# Patient Record
Sex: Female | Born: 1945 | Race: White | Hispanic: No | Smoking: Former smoker
Health system: Southern US, Community
[De-identification: ages and names within clinical notes are randomized; demographics above are authoritative.]

## PROBLEM LIST (undated history)

## (undated) DIAGNOSIS — Z923 Personal history of irradiation: Secondary | ICD-10-CM

## (undated) DIAGNOSIS — M1990 Unspecified osteoarthritis, unspecified site: Secondary | ICD-10-CM

## (undated) DIAGNOSIS — J302 Other seasonal allergic rhinitis: Secondary | ICD-10-CM

## (undated) DIAGNOSIS — Z972 Presence of dental prosthetic device (complete) (partial): Secondary | ICD-10-CM

## (undated) DIAGNOSIS — D563 Thalassemia minor: Secondary | ICD-10-CM

## (undated) DIAGNOSIS — C50919 Malignant neoplasm of unspecified site of unspecified female breast: Secondary | ICD-10-CM

## (undated) HISTORY — PX: TUBAL LIGATION: SHX77

## (undated) HISTORY — PX: BREAST LUMPECTOMY: SHX2

## (undated) HISTORY — PX: TONSILLECTOMY: SUR1361

---

## 1993-09-30 DIAGNOSIS — C50919 Malignant neoplasm of unspecified site of unspecified female breast: Secondary | ICD-10-CM

## 1993-09-30 HISTORY — PX: BREAST BIOPSY: SHX20

## 1993-09-30 HISTORY — DX: Malignant neoplasm of unspecified site of unspecified female breast: C50.919

## 2004-10-22 ENCOUNTER — Ambulatory Visit: Payer: Self-pay | Admitting: Family Medicine

## 2005-11-22 ENCOUNTER — Ambulatory Visit: Payer: Self-pay | Admitting: Family Medicine

## 2006-11-21 ENCOUNTER — Ambulatory Visit: Payer: Self-pay | Admitting: Family Medicine

## 2006-12-01 ENCOUNTER — Ambulatory Visit: Payer: Self-pay | Admitting: Family Medicine

## 2007-04-08 ENCOUNTER — Ambulatory Visit: Payer: Self-pay | Admitting: Podiatry

## 2007-04-08 HISTORY — PX: BUNIONECTOMY: SHX129

## 2007-12-04 ENCOUNTER — Ambulatory Visit: Payer: Self-pay | Admitting: Family Medicine

## 2008-11-28 ENCOUNTER — Ambulatory Visit: Payer: Self-pay | Admitting: Family Medicine

## 2008-12-12 ENCOUNTER — Ambulatory Visit: Payer: Self-pay | Admitting: Family Medicine

## 2010-03-19 ENCOUNTER — Ambulatory Visit: Payer: Self-pay | Admitting: Unknown Physician Specialty

## 2010-05-08 ENCOUNTER — Ambulatory Visit: Payer: Self-pay | Admitting: Unknown Physician Specialty

## 2010-05-21 ENCOUNTER — Ambulatory Visit: Payer: Self-pay | Admitting: Unknown Physician Specialty

## 2011-05-28 ENCOUNTER — Ambulatory Visit: Payer: Self-pay | Admitting: Family Medicine

## 2012-09-04 ENCOUNTER — Ambulatory Visit: Payer: Self-pay | Admitting: Family Medicine

## 2012-09-14 ENCOUNTER — Ambulatory Visit: Payer: Self-pay | Admitting: Family Medicine

## 2013-05-13 ENCOUNTER — Ambulatory Visit: Payer: Self-pay | Admitting: Family Medicine

## 2013-08-20 ENCOUNTER — Ambulatory Visit: Payer: Self-pay | Admitting: Gastroenterology

## 2013-08-23 LAB — PATHOLOGY REPORT

## 2013-10-28 ENCOUNTER — Ambulatory Visit: Payer: Self-pay | Admitting: Family Medicine

## 2014-09-14 ENCOUNTER — Ambulatory Visit: Payer: Self-pay | Admitting: Family Medicine

## 2014-11-10 ENCOUNTER — Ambulatory Visit: Payer: Self-pay | Admitting: Family Medicine

## 2015-11-14 ENCOUNTER — Encounter: Payer: Self-pay | Admitting: *Deleted

## 2015-11-16 NOTE — Discharge Instructions (Signed)

## 2015-11-20 ENCOUNTER — Encounter: Payer: Self-pay | Admitting: *Deleted

## 2015-11-20 ENCOUNTER — Ambulatory Visit: Payer: Medicare HMO | Admitting: Anesthesiology

## 2015-11-20 ENCOUNTER — Ambulatory Visit
Admission: RE | Admit: 2015-11-20 | Discharge: 2015-11-20 | Disposition: A | Discharge status: Home / Self Care | Payer: Medicare HMO | Source: Ambulatory Visit | Attending: Ophthalmology | Admitting: Ophthalmology

## 2015-11-20 ENCOUNTER — Encounter
Admission: RE | Disposition: A | Discharge status: Home / Self Care | Payer: Self-pay | Source: Ambulatory Visit | Attending: Ophthalmology

## 2015-11-20 DIAGNOSIS — H2512 Age-related nuclear cataract, left eye: Secondary | ICD-10-CM | POA: Diagnosis present

## 2015-11-20 DIAGNOSIS — D649 Anemia, unspecified: Secondary | ICD-10-CM | POA: Diagnosis not present

## 2015-11-20 DIAGNOSIS — D563 Thalassemia minor: Secondary | ICD-10-CM | POA: Insufficient documentation

## 2015-11-20 DIAGNOSIS — Z87891 Personal history of nicotine dependence: Secondary | ICD-10-CM | POA: Insufficient documentation

## 2015-11-20 DIAGNOSIS — Z880 Allergy status to penicillin: Secondary | ICD-10-CM | POA: Diagnosis not present

## 2015-11-20 DIAGNOSIS — Z853 Personal history of malignant neoplasm of breast: Secondary | ICD-10-CM | POA: Diagnosis not present

## 2015-11-20 DIAGNOSIS — Z79899 Other long term (current) drug therapy: Secondary | ICD-10-CM | POA: Insufficient documentation

## 2015-11-20 HISTORY — DX: Unspecified osteoarthritis, unspecified site: M19.90

## 2015-11-20 HISTORY — DX: Malignant neoplasm of unspecified site of unspecified female breast: C50.919

## 2015-11-20 HISTORY — DX: Other seasonal allergic rhinitis: J30.2

## 2015-11-20 HISTORY — DX: Thalassemia minor: D56.3

## 2015-11-20 HISTORY — PX: CATARACT EXTRACTION W/PHACO: SHX586

## 2015-11-20 HISTORY — DX: Presence of dental prosthetic device (complete) (partial): Z97.2

## 2015-11-20 SURGERY — PHACOEMULSIFICATION, CATARACT, WITH IOL INSERTION
Anesthesia: Monitor Anesthesia Care | Site: Eye | Laterality: Left | Wound class: Clean

## 2015-11-20 MED ORDER — ARMC OPHTHALMIC DILATING GEL
1.0000 "application " | OPHTHALMIC | Status: DC | PRN
  Administered 2015-11-20 (×2): 1 via OPHTHALMIC

## 2015-11-20 MED ORDER — MIDAZOLAM HCL 2 MG/2ML IJ SOLN
INTRAMUSCULAR | Status: DC | PRN
  Administered 2015-11-20: 2 mg via INTRAVENOUS

## 2015-11-20 MED ORDER — POVIDONE-IODINE 5 % OP SOLN
1.0000 "application " | OPHTHALMIC | Status: DC | PRN
  Administered 2015-11-20: 1 via OPHTHALMIC

## 2015-11-20 MED ORDER — LACTATED RINGERS IV SOLN: INTRAVENOUS | Status: DC

## 2015-11-20 MED ORDER — BSS IO SOLN
INTRAOCULAR | Status: DC | PRN
  Administered 2015-11-20: 85 mL via OPHTHALMIC

## 2015-11-20 MED ORDER — MOXIFLOXACIN HCL 0.5 % OP SOLN
OPHTHALMIC | Status: DC | PRN
  Administered 2015-11-20: .1 mL via OPHTHALMIC

## 2015-11-20 MED ORDER — TIMOLOL MALEATE 0.5 % OP SOLN
OPHTHALMIC | Status: DC | PRN
  Administered 2015-11-20: 1 [drp] via OPHTHALMIC

## 2015-11-20 MED ORDER — TETRACAINE HCL 0.5 % OP SOLN
1.0000 [drp] | OPHTHALMIC | Status: DC | PRN
  Administered 2015-11-20: 1 [drp] via OPHTHALMIC

## 2015-11-20 MED ORDER — NA HYALUR & NA CHOND-NA HYALUR 0.4-0.35 ML IO KIT
PACK | INTRAOCULAR | Status: DC | PRN
  Administered 2015-11-20: 1 mL via INTRAOCULAR

## 2015-11-20 MED ORDER — LIDOCAINE HCL (PF) 4 % IJ SOLN
INTRAOCULAR | Status: DC | PRN
  Administered 2015-11-20: 1 mL via OPHTHALMIC

## 2015-11-20 MED ORDER — BRIMONIDINE TARTRATE 0.2 % OP SOLN
OPHTHALMIC | Status: DC | PRN
  Administered 2015-11-20: 1 [drp] via OPHTHALMIC

## 2015-11-20 MED ORDER — FENTANYL CITRATE (PF) 100 MCG/2ML IJ SOLN
INTRAMUSCULAR | Status: DC | PRN
  Administered 2015-11-20: 50 ug via INTRAVENOUS

## 2015-11-20 SURGICAL SUPPLY — 29 items
APPLICATOR COTTON TIP 3IN (MISCELLANEOUS) ×3 IMPLANT
CANNULA ANT/CHMB 27GA (MISCELLANEOUS) ×3 IMPLANT
DISSECTOR HYDRO NUCLEUS 50X22 (MISCELLANEOUS) ×3 IMPLANT
GLOVE BIO SURGEON STRL SZ7 (GLOVE) ×3 IMPLANT
GLOVE SURG LX 6.5 MICRO (GLOVE) ×4
GLOVE SURG LX STRL 6.5 MICRO (GLOVE) ×2 IMPLANT
GOWN STRL REUS W/ TWL LRG LVL3 (GOWN DISPOSABLE) ×2 IMPLANT
GOWN STRL REUS W/TWL LRG LVL3 (GOWN DISPOSABLE) ×4
LENS IOL TECNIS 34.0 (Intraocular Lens) ×3 IMPLANT
LENS IOL TECNIS MONO 1P 34.0 (Intraocular Lens) ×1 IMPLANT
MARKER SKIN DUAL TIP RULER LAB (MISCELLANEOUS) ×3 IMPLANT
NEEDLE FILTER BLUNT 18X 1/2SAF (NEEDLE) ×4
NEEDLE FILTER BLUNT 18X1 1/2 (NEEDLE) ×2 IMPLANT
PACK CATARACT BRASINGTON (MISCELLANEOUS) ×3 IMPLANT
PACK EYE AFTER SURG (MISCELLANEOUS) ×3 IMPLANT
PACK OPTHALMIC (MISCELLANEOUS) ×3 IMPLANT
RING MALYGIN 7.0 (MISCELLANEOUS) IMPLANT
SOL BAL SALT 15ML (MISCELLANEOUS)
SOLUTION BAL SALT 15ML (MISCELLANEOUS) IMPLANT
SUT ETHILON 10-0 CS-B-6CS-B-6 (SUTURE)
SUT VICRYL  9 0 (SUTURE)
SUT VICRYL 9 0 (SUTURE) IMPLANT
SUTURE EHLN 10-0 CS-B-6CS-B-6 (SUTURE) IMPLANT
SYR 3ML LL SCALE MARK (SYRINGE) ×6 IMPLANT
SYR TB 1ML LUER SLIP (SYRINGE) ×3 IMPLANT
WATER STERILE IRR 250ML POUR (IV SOLUTION) ×3 IMPLANT
WATER STERILE IRR 500ML POUR (IV SOLUTION) IMPLANT
WICK EYE OCUCEL (MISCELLANEOUS) IMPLANT
WIPE NON LINTING 3.25X3.25 (MISCELLANEOUS) ×3 IMPLANT

## 2015-11-20 NOTE — Transfer of Care (Signed)
Immediate Anesthesia Transfer of Care Note  Patient: Carolyn Coleman  Procedure(s) Performed: Procedure(s): CATARACT EXTRACTION PHACO AND INTRAOCULAR LENS PLACEMENT (IOC) (Left)  Patient Location: PACU  Anesthesia Type: MAC  Level of Consciousness: awake, alert  and patient cooperative  Airway and Oxygen Therapy: Patient Spontanous Breathing and Patient connected to supplemental oxygen  Post-op Assessment: Post-op Vital signs reviewed, Patient's Cardiovascular Status Stable, Respiratory Function Stable, Patent Airway and No signs of Nausea or vomiting  Post-op Vital Signs: Reviewed and stable  Complications: No apparent anesthesia complications

## 2015-11-20 NOTE — Anesthesia Preprocedure Evaluation (Signed)
Anesthesia Evaluation  Patient identified by MRN, date of birth, ID band Patient awake    Reviewed: Allergy & Precautions, H&P , NPO status , Patient's Chart, lab work & pertinent test results, reviewed documented beta blocker date and time   Airway Mallampati: II  TM Distance: >3 FB Neck ROM: full    Dental no notable dental hx. (+) Upper Dentures, Lower Dentures   Pulmonary neg pulmonary ROS, former smoker,    Pulmonary exam normal breath sounds clear to auscultation       Cardiovascular Exercise Tolerance: Good negative cardio ROS   Rhythm:regular Rate:Normal     Neuro/Psych negative neurological ROS  negative psych ROS   GI/Hepatic negative GI ROS, Neg liver ROS,   Endo/Other  negative endocrine ROS  Renal/GU negative Renal ROS  negative genitourinary   Musculoskeletal   Abdominal   Peds  Hematology negative hematology ROS (+) anemia ,   Anesthesia Other Findings   Reproductive/Obstetrics negative OB ROS                             Anesthesia Physical Anesthesia Plan  ASA: II  Anesthesia Plan: MAC   Post-op Pain Management:    Induction:   Airway Management Planned:   Additional Equipment:   Intra-op Plan:   Post-operative Plan:   Informed Consent: I have reviewed the patients History and Physical, chart, labs and discussed the procedure including the risks, benefits and alternatives for the proposed anesthesia with the patient or authorized representative who has indicated his/her understanding and acceptance.   Dental Advisory Given  Plan Discussed with: CRNA  Anesthesia Plan Comments:         Anesthesia Quick Evaluation

## 2015-11-20 NOTE — Op Note (Signed)
Date of Surgery: 11/20/2015  PREOPERATIVE DIAGNOSES: Visually significant nuclear sclerotic cataract, left eye.  POSTOPERATIVE DIAGNOSES: Same  PROCEDURES PERFORMED: Cataract extraction with intraocular lens implant, left eye.  SURGEON: Almon Hercules, M.D.  ANESTHESIA: MAC and topical  IMPLANTS: AMO Tecnis [id number] +34.0  Implant Name Type Inv. Item Serial No. Manufacturer Lot No. LRB No. Used  LENS IOL TECNIS 34.0 - [id number] Intraocular Lens LENS IOL TECNIS 34.0 [id number] AMO   Left 1    COMPLICATIONS: None.  DESCRIPTION OF PROCEDURE: Therapeutic options were discussed with the patient preoperatively, including a discussion of risks and benefits of surgery. Informed consent was obtained. An IOL-Master and immersion biometry were used to take the lens measurements, and a dilated fundus exam was performed within 6 months of the surgical date.  The patient was premedicated and brought to the operating room and placed on the operating table in the supine position. After adequate anesthesia, the patient was prepped and draped in the usual sterile ophthalmic fashion. A wire lid speculum was inserted and the microscope was positioned. A Superblade was used to create a paracentesis site at the limbus and a small amount of dilute preservative free lidocaine was instilled into the anterior chamber, followed by dispersive viscoelastic. A clear corneal incision was created temporally using a 2.4 mm keratome blade. Capsulorrhexis was then performed. In situ phacoemulsification was performed.  Cortical material was removed with the irrigation-aspiration unit. Dispersive viscoelastic was instilled to open the capsular bag. A posterior chamber intraocular lens with the specifications above was inserted and positioned. Irrigation-aspiration was used to remove all viscoelastic. Vigamox 1cc was instilled into the anterior chamber, and the corneal incision was checked and found to be water tight. The eyelid  speculum was removed.  The operative eye was covered with protective goggles after instilling 1 drop of timolol and brimonidine. The patient tolerated the procedure well. There were no complications.

## 2015-11-20 NOTE — Anesthesia Postprocedure Evaluation (Signed)
Anesthesia Post Note  Patient: Carolyn Coleman  Procedure(s) Performed: Procedure(s) (LRB): CATARACT EXTRACTION PHACO AND INTRAOCULAR LENS PLACEMENT (IOC) (Left)  Patient location during evaluation: PACU Anesthesia Type: MAC Level of consciousness: awake and alert Pain management: pain level controlled Vital Signs Assessment: post-procedure vital signs reviewed and stable Respiratory status: spontaneous breathing, nonlabored ventilation, respiratory function stable and patient connected to nasal cannula oxygen Cardiovascular status: blood pressure returned to baseline and stable Postop Assessment: no signs of nausea or vomiting Anesthetic complications: no    Alisa Graff

## 2015-11-20 NOTE — H&P (Signed)
H+P reviewed and is up to date, please see paper chart.  

## 2015-11-20 NOTE — Anesthesia Procedure Notes (Signed)
Procedure Name: MAC Performed by: Avari Gelles Pre-anesthesia Checklist: Patient identified, Emergency Drugs available, Suction available, Timeout performed and Patient being monitored Patient Re-evaluated:Patient Re-evaluated prior to inductionOxygen Delivery Method: Nasal cannula Placement Confirmation: positive ETCO2     

## 2015-11-21 ENCOUNTER — Encounter: Payer: Self-pay | Admitting: Ophthalmology

## 2015-12-06 ENCOUNTER — Other Ambulatory Visit: Payer: Self-pay | Admitting: Family Medicine

## 2015-12-06 DIAGNOSIS — Z1231 Encounter for screening mammogram for malignant neoplasm of breast: Secondary | ICD-10-CM

## 2015-12-08 ENCOUNTER — Encounter: Payer: Self-pay | Admitting: *Deleted

## 2015-12-08 NOTE — Discharge Instructions (Signed)

## 2015-12-11 ENCOUNTER — Ambulatory Visit: Payer: Medicare HMO | Admitting: Anesthesiology

## 2015-12-11 ENCOUNTER — Encounter
Admission: RE | Disposition: A | Discharge status: Home / Self Care | Payer: Self-pay | Source: Ambulatory Visit | Attending: Ophthalmology

## 2015-12-11 ENCOUNTER — Ambulatory Visit
Admission: RE | Admit: 2015-12-11 | Discharge: 2015-12-11 | Disposition: A | Discharge status: Home / Self Care | Payer: Medicare HMO | Source: Ambulatory Visit | Attending: Ophthalmology | Admitting: Ophthalmology

## 2015-12-11 DIAGNOSIS — Z880 Allergy status to penicillin: Secondary | ICD-10-CM | POA: Insufficient documentation

## 2015-12-11 DIAGNOSIS — Z79899 Other long term (current) drug therapy: Secondary | ICD-10-CM | POA: Insufficient documentation

## 2015-12-11 DIAGNOSIS — Z9851 Tubal ligation status: Secondary | ICD-10-CM | POA: Insufficient documentation

## 2015-12-11 DIAGNOSIS — Z87891 Personal history of nicotine dependence: Secondary | ICD-10-CM | POA: Insufficient documentation

## 2015-12-11 DIAGNOSIS — H2511 Age-related nuclear cataract, right eye: Secondary | ICD-10-CM | POA: Diagnosis present

## 2015-12-11 DIAGNOSIS — Z853 Personal history of malignant neoplasm of breast: Secondary | ICD-10-CM | POA: Diagnosis not present

## 2015-12-11 HISTORY — PX: CATARACT EXTRACTION W/PHACO: SHX586

## 2015-12-11 SURGERY — PHACOEMULSIFICATION, CATARACT, WITH IOL INSERTION
Anesthesia: Monitor Anesthesia Care | Laterality: Right | Wound class: Clean

## 2015-12-11 MED ORDER — BSS IO SOLN
INTRAOCULAR | Status: DC | PRN
  Administered 2015-12-11: 88 mL via OPHTHALMIC

## 2015-12-11 MED ORDER — NA HYALUR & NA CHOND-NA HYALUR 0.4-0.35 ML IO KIT
PACK | INTRAOCULAR | Status: DC | PRN
  Administered 2015-12-11: 1 mL via INTRAOCULAR

## 2015-12-11 MED ORDER — LIDOCAINE HCL (PF) 4 % IJ SOLN
INTRAOCULAR | Status: DC | PRN
  Administered 2015-12-11: 4 mL via OPHTHALMIC

## 2015-12-11 MED ORDER — MIDAZOLAM HCL 2 MG/2ML IJ SOLN
INTRAMUSCULAR | Status: DC | PRN
  Administered 2015-12-11: 2 mg via INTRAVENOUS

## 2015-12-11 MED ORDER — MOXIFLOXACIN HCL 0.5 % OP SOLN
OPHTHALMIC | Status: DC | PRN
  Administered 2015-12-11: .2 mL via OPHTHALMIC

## 2015-12-11 MED ORDER — ACETAMINOPHEN 160 MG/5ML PO SOLN: 325.0000 mg | ORAL | Status: DC | PRN

## 2015-12-11 MED ORDER — TETRACAINE HCL 0.5 % OP SOLN
1.0000 [drp] | OPHTHALMIC | Status: DC | PRN
  Administered 2015-12-11: 1 [drp] via OPHTHALMIC

## 2015-12-11 MED ORDER — ACETAMINOPHEN 325 MG PO TABS: 325.0000 mg | ORAL_TABLET | ORAL | Status: DC | PRN

## 2015-12-11 MED ORDER — ONDANSETRON HCL 4 MG/2ML IJ SOLN: 4.0000 mg | Freq: Once | INTRAMUSCULAR | Status: DC | PRN

## 2015-12-11 MED ORDER — POVIDONE-IODINE 5 % OP SOLN
1.0000 "application " | OPHTHALMIC | Status: DC | PRN
  Administered 2015-12-11: 1 via OPHTHALMIC

## 2015-12-11 MED ORDER — FENTANYL CITRATE (PF) 100 MCG/2ML IJ SOLN
INTRAMUSCULAR | Status: DC | PRN
  Administered 2015-12-11: 50 ug via INTRAVENOUS

## 2015-12-11 MED ORDER — TIMOLOL MALEATE 0.5 % OP SOLN
OPHTHALMIC | Status: DC | PRN
  Administered 2015-12-11: 1 [drp] via OPHTHALMIC

## 2015-12-11 MED ORDER — BRIMONIDINE TARTRATE 0.2 % OP SOLN
OPHTHALMIC | Status: DC | PRN
  Administered 2015-12-11: 1 [drp] via OPHTHALMIC

## 2015-12-11 MED ORDER — ARMC OPHTHALMIC DILATING GEL
1.0000 "application " | OPHTHALMIC | Status: DC | PRN
  Administered 2015-12-11 (×2): 1 via OPHTHALMIC

## 2015-12-11 SURGICAL SUPPLY — 29 items
APPLICATOR COTTON TIP 3IN (MISCELLANEOUS) ×3 IMPLANT
CANNULA ANT/CHMB 27GA (MISCELLANEOUS) ×3 IMPLANT
DISSECTOR HYDRO NUCLEUS 50X22 (MISCELLANEOUS) ×3 IMPLANT
GLOVE BIO SURGEON STRL SZ7 (GLOVE) ×3 IMPLANT
GLOVE SURG LX 6.5 MICRO (GLOVE) ×2
GLOVE SURG LX STRL 6.5 MICRO (GLOVE) ×1 IMPLANT
GOWN STRL REUS W/ TWL LRG LVL3 (GOWN DISPOSABLE) ×2 IMPLANT
GOWN STRL REUS W/TWL LRG LVL3 (GOWN DISPOSABLE) ×4
LENS IOL TECNIS 32.5 (Intraocular Lens) ×3 IMPLANT
LENS IOL TECNIS MONO 1P 32.5 (Intraocular Lens) ×1 IMPLANT
MARKER SKIN DUAL TIP RULER LAB (MISCELLANEOUS) ×3 IMPLANT
NEEDLE FILTER BLUNT 18X 1/2SAF (NEEDLE) ×2
NEEDLE FILTER BLUNT 18X1 1/2 (NEEDLE) ×1 IMPLANT
PACK CATARACT BRASINGTON (MISCELLANEOUS) ×3 IMPLANT
PACK EYE AFTER SURG (MISCELLANEOUS) ×3 IMPLANT
PACK OPTHALMIC (MISCELLANEOUS) ×3 IMPLANT
RING MALYGIN 7.0 (MISCELLANEOUS) IMPLANT
SOL BAL SALT 15ML (MISCELLANEOUS)
SOLUTION BAL SALT 15ML (MISCELLANEOUS) IMPLANT
SUT ETHILON 10-0 CS-B-6CS-B-6 (SUTURE)
SUT VICRYL  9 0 (SUTURE)
SUT VICRYL 9 0 (SUTURE) IMPLANT
SUTURE EHLN 10-0 CS-B-6CS-B-6 (SUTURE) IMPLANT
SYR 3ML LL SCALE MARK (SYRINGE) ×3 IMPLANT
SYR TB 1ML LUER SLIP (SYRINGE) ×3 IMPLANT
WATER STERILE IRR 250ML POUR (IV SOLUTION) ×3 IMPLANT
WATER STERILE IRR 500ML POUR (IV SOLUTION) IMPLANT
WICK EYE OCUCEL (MISCELLANEOUS) IMPLANT
WIPE NON LINTING 3.25X3.25 (MISCELLANEOUS) ×3 IMPLANT

## 2015-12-11 NOTE — Anesthesia Postprocedure Evaluation (Signed)
Anesthesia Post Note  Patient: Carolyn Coleman  Procedure(s) Performed: Procedure(s) (LRB): CATARACT EXTRACTION PHACO AND INTRAOCULAR LENS PLACEMENT (IOC) (Right)  Patient location during evaluation: PACU Anesthesia Type: MAC Level of consciousness: awake and alert Pain management: pain level controlled Vital Signs Assessment: post-procedure vital signs reviewed and stable Respiratory status: spontaneous breathing, nonlabored ventilation, respiratory function stable and patient connected to nasal cannula oxygen Cardiovascular status: stable and blood pressure returned to baseline Anesthetic complications: no    Amaryllis Dyke

## 2015-12-11 NOTE — H&P (Signed)
H+P reviewed and is up to date, please see paper chart.  

## 2015-12-11 NOTE — Transfer of Care (Signed)
Immediate Anesthesia Transfer of Care Note  Patient: Carolyn Coleman  Procedure(s) Performed: Procedure(s): CATARACT EXTRACTION PHACO AND INTRAOCULAR LENS PLACEMENT (IOC) (Right)  Patient Location: PACU  Anesthesia Type: MAC  Level of Consciousness: awake, alert  and patient cooperative  Airway and Oxygen Therapy: Patient Spontanous Breathing and Patient connected to supplemental oxygen  Post-op Assessment: Post-op Vital signs reviewed, Patient's Cardiovascular Status Stable, Respiratory Function Stable, Patent Airway and No signs of Nausea or vomiting  Post-op Vital Signs: Reviewed and stable  Complications: No apparent anesthesia complications

## 2015-12-11 NOTE — Anesthesia Preprocedure Evaluation (Signed)
Anesthesia Evaluation  Patient identified by MRN, date of birth, ID band Patient awake    Reviewed: Allergy & Precautions, H&P , NPO status , Patient's Chart, lab work & pertinent test results, reviewed documented beta blocker date and time   Airway Mallampati: II  TM Distance: >3 FB Neck ROM: full    Dental no notable dental hx. (+) Upper Dentures, Lower Dentures   Pulmonary neg pulmonary ROS, former smoker,    Pulmonary exam normal breath sounds clear to auscultation       Cardiovascular Exercise Tolerance: Good negative cardio ROS   Rhythm:regular Rate:Normal     Neuro/Psych negative neurological ROS  negative psych ROS   GI/Hepatic negative GI ROS, Neg liver ROS,   Endo/Other  negative endocrine ROS  Renal/GU negative Renal ROS  negative genitourinary   Musculoskeletal   Abdominal   Peds  Hematology negative hematology ROS (+) anemia ,   Anesthesia Other Findings   Reproductive/Obstetrics negative OB ROS                             Anesthesia Physical  Anesthesia Plan  ASA: II  Anesthesia Plan: MAC   Post-op Pain Management:    Induction:   Airway Management Planned:   Additional Equipment:   Intra-op Plan:   Post-operative Plan:   Informed Consent: I have reviewed the patients History and Physical, chart, labs and discussed the procedure including the risks, benefits and alternatives for the proposed anesthesia with the patient or authorized representative who has indicated his/her understanding and acceptance.   Dental Advisory Given  Plan Discussed with: CRNA  Anesthesia Plan Comments:         Anesthesia Quick Evaluation

## 2015-12-11 NOTE — Anesthesia Procedure Notes (Signed)
Procedure Name: MAC Performed by: Tyshia Fenter Pre-anesthesia Checklist: Patient identified, Emergency Drugs available, Suction available, Timeout performed and Patient being monitored Patient Re-evaluated:Patient Re-evaluated prior to inductionOxygen Delivery Method: Nasal cannula Placement Confirmation: positive ETCO2     

## 2015-12-11 NOTE — Op Note (Signed)
Date of Surgery: 12/11/2015  PREOPERATIVE DIAGNOSES: Visually significant nuclear sclerotic cataract, right eye.  POSTOPERATIVE DIAGNOSES: Same  PROCEDURES PERFORMED: Cataract extraction with intraocular lens implant, right eye.  SURGEON: Almon Hercules, M.D.  ANESTHESIA: MAC and topical  IMPLANTS:   Implant Name Type Inv. Item Serial No. Manufacturer Lot No. LRB No. Used  LENS IOL TECNIS 32.5 - [id number] Intraocular Lens LENS IOL TECNIS 32.5 [id number] AMO   Right 1     COMPLICATIONS: None.  DESCRIPTION OF PROCEDURE: Therapeutic options were discussed with the patient preoperatively, including a discussion of risks and benefits of surgery. Informed consent was obtained. An IOL-Master and immersion biometry were used to take the lens measurements, and a dilated fundus exam was performed within 6 months of the surgical date.  The patient was premedicated and brought to the operating room and placed on the operating table in the supine position. After adequate anesthesia, the patient was prepped and draped in the usual sterile ophthalmic fashion. A wire lid speculum was inserted and the microscope was positioned. A Superblade was used to create a paracentesis site at the limbus and a small amount of dilute preservative free lidocaine was instilled into the anterior chamber, followed by dispersive viscoelastic. A clear corneal incision was created temporally using a 2.4 mm keratome blade. Capsulorrhexis was then performed. In situ phacoemulsification was performed.  Cortical material was removed with the irrigation-aspiration unit. Dispersive viscoelastic was instilled to open the capsular bag. A posterior chamber intraocular lens with the specifications above was inserted and positioned. Irrigation-aspiration was used to remove all viscoelastic. Vigamox 1cc was instilled into the anterior chamber, and the corneal incision was checked and found to be water tight. The eyelid speculum was  removed.  The operative eye was covered with protective goggles after instilling 1 drop of timolol and brimonidine. The patient tolerated the procedure well. There were no complications.

## 2015-12-12 ENCOUNTER — Encounter: Payer: Self-pay | Admitting: Ophthalmology

## 2015-12-13 ENCOUNTER — Other Ambulatory Visit: Payer: Self-pay | Admitting: Family Medicine

## 2015-12-13 DIAGNOSIS — R935 Abnormal findings on diagnostic imaging of other abdominal regions, including retroperitoneum: Secondary | ICD-10-CM

## 2015-12-19 ENCOUNTER — Ambulatory Visit: Payer: Medicare HMO

## 2015-12-29 ENCOUNTER — Ambulatory Visit
Admission: RE | Admit: 2015-12-29 | Discharge: 2015-12-29 | Disposition: A | Discharge status: Home / Self Care | Payer: Medicare HMO | Source: Ambulatory Visit | Attending: Family Medicine | Admitting: Family Medicine

## 2015-12-29 ENCOUNTER — Other Ambulatory Visit
Admission: RE | Admit: 2015-12-29 | Discharge: 2015-12-29 | Disposition: A | Discharge status: Home / Self Care | Payer: Medicare HMO | Source: Ambulatory Visit | Attending: Family Medicine | Admitting: Family Medicine

## 2015-12-29 DIAGNOSIS — R935 Abnormal findings on diagnostic imaging of other abdominal regions, including retroperitoneum: Secondary | ICD-10-CM | POA: Diagnosis present

## 2015-12-29 DIAGNOSIS — K769 Liver disease, unspecified: Secondary | ICD-10-CM | POA: Diagnosis not present

## 2015-12-29 DIAGNOSIS — Z1231 Encounter for screening mammogram for malignant neoplasm of breast: Secondary | ICD-10-CM

## 2015-12-29 DIAGNOSIS — R590 Localized enlarged lymph nodes: Secondary | ICD-10-CM | POA: Insufficient documentation

## 2015-12-29 DIAGNOSIS — K862 Cyst of pancreas: Secondary | ICD-10-CM | POA: Diagnosis not present

## 2015-12-29 LAB — CREATININE, SERUM
CREATININE: 0.77 mg/dL (ref 0.44–1.00)
GFR calc Af Amer: >60 mL/min (ref >60)
GFR calc non Af Amer: >60 mL/min (ref >60)

## 2015-12-29 MED ORDER — IOHEXOL 350 MG/ML SOLN
125.0000 mL | Freq: Once | INTRAVENOUS | Status: AC | PRN
  Administered 2015-12-29: 125 mL via INTRAVENOUS

## 2017-01-03 ENCOUNTER — Other Ambulatory Visit: Payer: Self-pay | Admitting: Family Medicine

## 2017-01-03 DIAGNOSIS — Z1231 Encounter for screening mammogram for malignant neoplasm of breast: Secondary | ICD-10-CM

## 2017-01-13 ENCOUNTER — Other Ambulatory Visit: Payer: Self-pay | Admitting: Family Medicine

## 2017-01-13 ENCOUNTER — Ambulatory Visit
Admission: RE | Admit: 2017-01-13 | Discharge: 2017-01-13 | Disposition: A | Discharge status: Home / Self Care | Payer: Medicare HMO | Source: Ambulatory Visit | Attending: Family Medicine | Admitting: Family Medicine

## 2017-01-13 DIAGNOSIS — R932 Abnormal findings on diagnostic imaging of liver and biliary tract: Secondary | ICD-10-CM

## 2017-01-13 DIAGNOSIS — Z1231 Encounter for screening mammogram for malignant neoplasm of breast: Secondary | ICD-10-CM | POA: Diagnosis present

## 2017-01-13 HISTORY — DX: Personal history of irradiation: Z92.3

## 2017-01-20 ENCOUNTER — Ambulatory Visit: Payer: Medicare HMO

## 2017-01-27 ENCOUNTER — Ambulatory Visit
Admission: RE | Admit: 2017-01-27 | Discharge: 2017-01-27 | Disposition: A | Discharge status: Home / Self Care | Payer: Medicare HMO | Source: Ambulatory Visit | Attending: Family Medicine | Admitting: Family Medicine

## 2017-01-27 DIAGNOSIS — R932 Abnormal findings on diagnostic imaging of liver and biliary tract: Secondary | ICD-10-CM | POA: Diagnosis not present

## 2017-02-07 ENCOUNTER — Encounter: Payer: Self-pay | Admitting: *Deleted

## 2017-02-10 ENCOUNTER — Encounter: Payer: Self-pay | Admitting: *Deleted

## 2017-02-10 ENCOUNTER — Encounter
Admission: RE | Disposition: A | Discharge status: Home / Self Care | Payer: Self-pay | Source: Ambulatory Visit | Attending: Unknown Physician Specialty

## 2017-02-10 ENCOUNTER — Ambulatory Visit: Payer: Medicare HMO | Admitting: Anesthesiology

## 2017-02-10 ENCOUNTER — Ambulatory Visit
Admission: RE | Admit: 2017-02-10 | Discharge: 2017-02-10 | Disposition: A | Discharge status: Home / Self Care | Payer: Medicare HMO | Source: Ambulatory Visit | Attending: Unknown Physician Specialty | Admitting: Unknown Physician Specialty

## 2017-02-10 DIAGNOSIS — K210 Gastro-esophageal reflux disease with esophagitis: Secondary | ICD-10-CM | POA: Diagnosis not present

## 2017-02-10 DIAGNOSIS — D122 Benign neoplasm of ascending colon: Secondary | ICD-10-CM | POA: Diagnosis not present

## 2017-02-10 DIAGNOSIS — D563 Thalassemia minor: Secondary | ICD-10-CM | POA: Diagnosis not present

## 2017-02-10 DIAGNOSIS — Z923 Personal history of irradiation: Secondary | ICD-10-CM | POA: Insufficient documentation

## 2017-02-10 DIAGNOSIS — D120 Benign neoplasm of cecum: Secondary | ICD-10-CM | POA: Insufficient documentation

## 2017-02-10 DIAGNOSIS — Z87891 Personal history of nicotine dependence: Secondary | ICD-10-CM | POA: Diagnosis not present

## 2017-02-10 DIAGNOSIS — Z79899 Other long term (current) drug therapy: Secondary | ICD-10-CM | POA: Diagnosis not present

## 2017-02-10 DIAGNOSIS — K640 First degree hemorrhoids: Secondary | ICD-10-CM | POA: Insufficient documentation

## 2017-02-10 DIAGNOSIS — K5730 Diverticulosis of large intestine without perforation or abscess without bleeding: Secondary | ICD-10-CM | POA: Diagnosis not present

## 2017-02-10 DIAGNOSIS — D500 Iron deficiency anemia secondary to blood loss (chronic): Secondary | ICD-10-CM | POA: Insufficient documentation

## 2017-02-10 DIAGNOSIS — K635 Polyp of colon: Secondary | ICD-10-CM | POA: Diagnosis not present

## 2017-02-10 DIAGNOSIS — K2950 Unspecified chronic gastritis without bleeding: Secondary | ICD-10-CM | POA: Insufficient documentation

## 2017-02-10 DIAGNOSIS — Z853 Personal history of malignant neoplasm of breast: Secondary | ICD-10-CM | POA: Diagnosis not present

## 2017-02-10 HISTORY — PX: ESOPHAGOGASTRODUODENOSCOPY (EGD) WITH PROPOFOL: SHX5813

## 2017-02-10 HISTORY — PX: COLONOSCOPY WITH PROPOFOL: SHX5780

## 2017-02-10 SURGERY — COLONOSCOPY WITH PROPOFOL
Anesthesia: General

## 2017-02-10 MED ORDER — PROPOFOL 10 MG/ML IV BOLUS
INTRAVENOUS | Status: DC | PRN
  Administered 2017-02-10: 50 mg via INTRAVENOUS

## 2017-02-10 MED ORDER — SODIUM CHLORIDE 0.9 % IV SOLN: INTRAVENOUS | Status: DC

## 2017-02-10 MED ORDER — PROPOFOL 500 MG/50ML IV EMUL
INTRAVENOUS | Status: DC | PRN
  Administered 2017-02-10: 125 ug/kg/min via INTRAVENOUS

## 2017-02-10 MED ORDER — PHENYLEPHRINE HCL 10 MG/ML IJ SOLN
INTRAMUSCULAR | Status: DC | PRN
  Administered 2017-02-10 (×4): 40 ug via INTRAVENOUS

## 2017-02-10 MED ORDER — SODIUM CHLORIDE 0.9 % IV SOLN
INTRAVENOUS | Status: DC
  Administered 2017-02-10 (×2): via INTRAVENOUS

## 2017-02-10 MED ORDER — PROPOFOL 500 MG/50ML IV EMUL
INTRAVENOUS | Status: AC
  Filled 2017-02-10: qty 50

## 2017-02-10 NOTE — Anesthesia Postprocedure Evaluation (Signed)
Anesthesia Post Note  Patient: Carolyn Coleman  Procedure(s) Performed: Procedure(s) (LRB): COLONOSCOPY WITH PROPOFOL (N/A) ESOPHAGOGASTRODUODENOSCOPY (EGD) WITH PROPOFOL (N/A)  Patient location during evaluation: Endoscopy Anesthesia Type: General Level of consciousness: awake and alert Pain management: pain level controlled Vital Signs Assessment: post-procedure vital signs reviewed and stable Respiratory status: spontaneous breathing and respiratory function stable Cardiovascular status: stable Anesthetic complications: no     Last Vitals:  Vitals:   02/10/17 1004 02/10/17 1139  BP: (!) 159/76 109/78  Pulse: (!) 102 69  Resp: 16 15  Temp: 36.3 C (!) 35.8 C    Last Pain:  Vitals:   02/10/17 1139  TempSrc: Tympanic                 Ulysse Siemen K

## 2017-02-10 NOTE — Transfer of Care (Signed)
Immediate Anesthesia Transfer of Care Note  Patient: Carolyn Coleman  Procedure(s) Performed: Procedure(s): COLONOSCOPY WITH PROPOFOL (N/A) ESOPHAGOGASTRODUODENOSCOPY (EGD) WITH PROPOFOL (N/A)  Patient Location: PACU and Endoscopy Unit  Anesthesia Type:General  Level of Consciousness: awake, alert  and oriented  Airway & Oxygen Therapy: Patient Spontanous Breathing and Patient connected to nasal cannula oxygen  Post-op Assessment: Report given to RN and Post -op Vital signs reviewed and stable  Post vital signs: Reviewed and stable  Last Vitals:  Vitals:   02/10/17 1004 02/10/17 1139  BP: (!) 159/76 109/78  Pulse: (!) 102 69  Resp: 16 15  Temp: 36.3 C (!) 35.8 C    Last Pain:  Vitals:   02/10/17 1139  TempSrc: Tympanic         Complications: No apparent anesthesia complications

## 2017-02-10 NOTE — Op Note (Signed)
[facility] Gastroenterology Patient Name: Carolyn Coleman Procedure Date: 02/10/2017 10:34 AM MRN: [id number] Account #: [id number] Date of Birth: 1946-06-02 Admit Type: Outpatient Age: 71 Room: [facility] ENDO ROOM 3 Gender: Female Note Status: Finalized Procedure:            Upper GI endoscopy Indications:          Iron deficiency anemia secondary to chronic blood loss,                        Unexplained iron deficiency anemia Providers:            Manya Silvas, MD Referring MD:         Lynnell Jude (Referring MD) Medicines:            Propofol per Anesthesia Complications:        No immediate complications. Procedure:            Pre-Anesthesia Assessment:                       - After reviewing the risks and benefits, the patient                        was deemed in satisfactory condition to undergo the                        procedure.                       After obtaining informed consent, the endoscope was                        passed under direct vision. Throughout the procedure,                        the patient's blood pressure, pulse, and oxygen                        saturations were monitored continuously. The Endoscope                        was introduced through the mouth, and advanced to the                        second part of duodenum. The upper GI endoscopy was                        accomplished without difficulty. The patient tolerated                        the procedure well. Findings:      LA Grade C (one or more mucosal breaks continuous between tops of 2 or       more mucosal folds, less than 75% circumference) esophagitis with no       bleeding was found 38 cm from the incisors. Biopsies were taken with a       cold forceps for histology.      A single 7 mm mucosal nodule with a localized distribution was found at       the gastroesophageal junction, 38 cm from the incisors. Biopsies were       taken with a  cold forceps for  histology.      Patchy mild inflammation characterized by erythema and granularity was       found in the gastric body and in the gastric antrum.      Patchy mild inflammation characterized by erythema and granularity was       found in the gastric body and in the gastric antrum. Biopsies were taken       with a cold forceps for histology. Biopsies were taken with a cold       forceps for Helicobacter pylori testing.      The duodenal bulb, second portion of the duodenum and examined duodenum       were normal. Impression:           - LA Grade C reflux esophagitis. Rule out Barrett's                        esophagus. Biopsied.                       - Mucosal nodule found in the esophagus. Biopsied.                       - Gastritis.                       - Gastritis. Biopsied.                       - Normal duodenal bulb, second portion of the duodenum                        and examined duodenum. Recommendation:       - Await pathology results. Manya Silvas, MD 02/10/2017 11:10:00 AM This report has been signed electronically. Number of Addenda: 0 Note Initiated On: 02/10/2017 10:34 AM      [facility]

## 2017-02-10 NOTE — Anesthesia Post-op Follow-up Note (Cosign Needed)
Anesthesia QCDR form completed.        

## 2017-02-10 NOTE — Anesthesia Preprocedure Evaluation (Signed)
Anesthesia Evaluation  Patient identified by MRN, date of birth, ID band Patient awake    Reviewed: Allergy & Precautions, NPO status , Patient's Chart, lab work & pertinent test results  History of Anesthesia Complications Negative for: history of anesthetic complications  Airway Mallampati: II       Dental  (+) Upper Dentures, Lower Dentures   Pulmonary neg pulmonary ROS, former smoker (quit x 20+ years),           Cardiovascular negative cardio ROS       Neuro/Psych negative neurological ROS     GI/Hepatic negative GI ROS, Neg liver ROS,   Endo/Other  negative endocrine ROS  Renal/GU negative Renal ROS     Musculoskeletal   Abdominal   Peds  Hematology  (+) anemia ,   Anesthesia Other Findings   Reproductive/Obstetrics                             Anesthesia Physical Anesthesia Plan  ASA: I  Anesthesia Plan: General   Post-op Pain Management:    Induction: Intravenous  Airway Management Planned: Nasal Cannula  Additional Equipment:   Intra-op Plan:   Post-operative Plan:   Informed Consent: I have reviewed the patients History and Physical, chart, labs and discussed the procedure including the risks, benefits and alternatives for the proposed anesthesia with the patient or authorized representative who has indicated his/her understanding and acceptance.     Plan Discussed with:   Anesthesia Plan Comments:         Anesthesia Quick Evaluation

## 2017-02-10 NOTE — H&P (Signed)
Primary Care Physician:  Lynnell Jude, MD Primary Gastroenterologist:  Dr. Vira Agar  Pre-Procedure History & Physical: HPI:  Carolyn Coleman is a 71 y.o. female is here for an endoscopy and colonoscopy.   Past Medical History:  Diagnosis Date  . Arthritis    fingers  . Breast cancer [facility]) 1995   right Patient had radiation  . Personal history of radiation therapy   . Seasonal allergies   . Thalassemia minor   . Wears dentures    full upper and lower    Past Surgical History:  Procedure Laterality Date  . BREAST BIOPSY Right 1995   positive  . BUNIONECTOMY Right 04/08/07   Roma Schanz, [facility]  . CATARACT EXTRACTION W/PHACO Left 11/20/2015   Procedure: CATARACT EXTRACTION PHACO AND INTRAOCULAR LENS PLACEMENT (IOC);  Surgeon: Ronnell Freshwater, MD;  Location: [place];  Service: Ophthalmology;  Laterality: Left;  . CATARACT EXTRACTION W/PHACO Right 12/11/2015   Procedure: CATARACT EXTRACTION PHACO AND INTRAOCULAR LENS PLACEMENT (IOC);  Surgeon: Ronnell Freshwater, MD;  Location: [place];  Service: Ophthalmology;  Laterality: Right;  . TONSILLECTOMY    . TUBAL LIGATION      Prior to Admission medications   Medication Sig Start Date End Date Taking? Authorizing Provider  Cholecalciferol (VITAMIN D PO) Take by mouth daily.   Yes [provider]  Multiple Vitamin (MULTIVITAMIN) capsule Take 1 capsule by mouth daily.   Yes [provider]  Nutritional Supplements (NUTRITIONAL SUPPLEMENT PO) Take by mouth daily. RELEV-IT   Yes [provider]  Omega-3 Fatty Acids (FISH OIL PO) Take by mouth daily.   Yes [provider]    Allergies as of 01/31/2017 - Review Complete 12/11/2015  Allergen Reaction Noted  . Tape Other (See Comments) 11/14/2015  . Penicillins Rash 11/14/2015    Family History  Problem Relation Age of Onset  . Breast cancer Maternal Aunt 69    Social History   Social History  . Marital  status: Married    Spouse name: N/A  . Number of children: N/A  . Years of education: N/A   Occupational History  . Not on file.   Social History Main Topics  . Smoking status: Former Smoker    Quit date: 10/01/1995  . Smokeless tobacco: Never Used  . Alcohol use No  . Drug use: No  . Sexual activity: Not on file   Other Topics Concern  . Not on file   Social History Narrative  . No narrative on file    Review of Systems: See HPI, otherwise negative ROS  Physical Exam: BP (!) 159/76   Pulse (!) 102   Temp 97.4 F (36.3 C) (Tympanic)   Resp 16   Ht 5\' 3"  (1.6 m)   Wt 73.9 kg (163 lb)   SpO2 100%   BMI 28.87 kg/m  General:   Alert,  pleasant and cooperative in NAD Head:  Normocephalic and atraumatic. Neck:  Supple; no masses or thyromegaly. Lungs:  Clear throughout to auscultation.    Heart:  Regular rate and rhythm. Abdomen:  Soft, nontender and nondistended. Normal bowel sounds, without guarding, and without rebound.   Neurologic:  Alert and  oriented x4;  grossly normal neurologically.  Impression/Plan: Carolyn Coleman is here for an endoscopy and colonoscopy to be performed for iron def anemia  Risks, benefits, limitations, and alternatives regarding  endoscopy and colonoscopy have been reviewed with the patient.  Questions have been answered.  All  parties agreeable.   Gaylyn Cheers, MD  02/10/2017, 10:40 AM

## 2017-02-10 NOTE — Op Note (Signed)
[facility] Gastroenterology Patient Name: Carolyn Coleman Procedure Date: 02/10/2017 10:34 AM MRN: [id number] Account #: [id number] Date of Birth: 10/29/1945 Admit Type: Outpatient Age: 70 Room: [facility] ENDO ROOM 3 Gender: Female Note Status: Finalized Procedure:            Colonoscopy Indications:          Iron deficiency anemia Providers:            Manya Silvas, MD Medicines:            Propofol per Anesthesia Complications:        No immediate complications. Procedure:            Pre-Anesthesia Assessment:                       - After reviewing the risks and benefits, the patient                        was deemed in satisfactory condition to undergo the                        procedure.                       After obtaining informed consent, the colonoscope was                        passed under direct vision. Throughout the procedure,                        the patient's blood pressure, pulse, and oxygen                        saturations were monitored continuously. The                        Colonoscope was introduced through the anus and                        advanced to the the cecum, identified by appendiceal                        orifice and ileocecal valve. The colonoscopy was                        performed without difficulty. The patient tolerated the                        procedure well. The quality of the bowel preparation                        was excellent. Findings:      Three sessile polyps were found in the ascending colon and cecum. The       polyps were diminutive in size. These polyps were removed with a jumbo       cold forceps. Resection and retrieval were complete.      A diminutive polyp was found in the recto-sigmoid colon. The polyp was       sessile. The polyp was removed with a hot snare. Resection and retrieval       were complete.      Multiple medium-mouthed diverticula  were found in the descending colon,   transverse colon and ascending colon.      Internal hemorrhoids were found during endoscopy. The hemorrhoids were       small and Grade I (internal hemorrhoids that do not prolapse). Impression:           - Three diminutive polyps in the ascending colon and in                        the cecum, removed with a jumbo cold forceps. Resected                        and retrieved.                       - One diminutive polyp at the recto-sigmoid colon,                        removed with a hot snare. Resected and retrieved.                       - Diverticulosis in the descending colon, in the                        transverse colon and in the ascending colon.                       - Internal hemorrhoids. Recommendation:       - Await pathology results. Manya Silvas, MD 02/10/2017 11:40:03 AM This report has been signed electronically. Number of Addenda: 0 Note Initiated On: 02/10/2017 10:34 AM Scope Withdrawal Time: 0 hours 14 minutes 46 seconds  Total Procedure Duration: 0 hours 22 minutes 2 seconds       [facility]

## 2017-02-11 ENCOUNTER — Encounter: Payer: Self-pay | Admitting: Unknown Physician Specialty

## 2017-02-11 LAB — SURGICAL PATHOLOGY

## 2017-04-19 IMAGING — CT CT ABDOMEN WO/W CM
2 of 10 series · 13 of 46 positions shown, 19 images · IV contrast (isovue)
Comparison: CT 09/14/2014

CLINICAL DATA: Follow-up pancreatic cyst. Personal history of
breast cancer.

EXAM:
CT ABDOMEN WITHOUT AND WITH CONTRAST
TECHNIQUE: Multidetector CT imaging of the abdomen was performed following the
standard protocol before and following the bolus administration of
intravenous contrast.
CONTRAST:  125 cc Isovue

[Series 7: pancreas venous thins · axial · portal-venous · 0.69mm/px · z∈[-713,-477]mm · 10 of 241 slices shown, 16 images]
[im 22/241  soft-tissue]
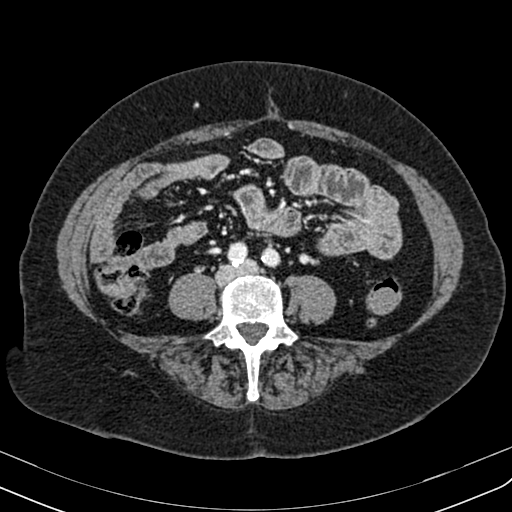
[im 22/241  bone]
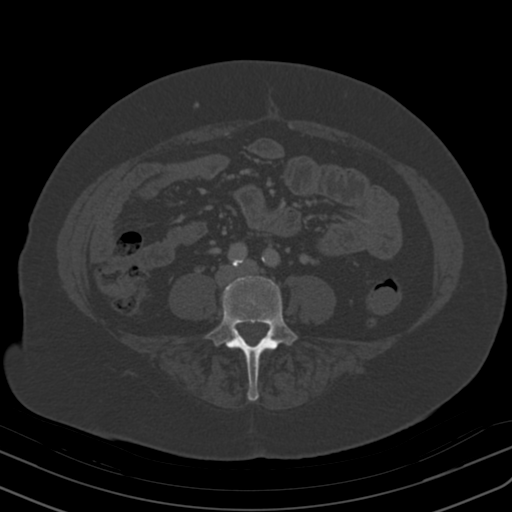
[im 44/241  soft-tissue]
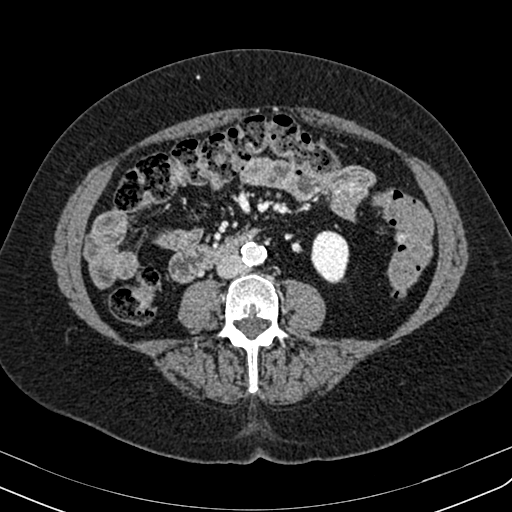
[im 66/241  soft-tissue]
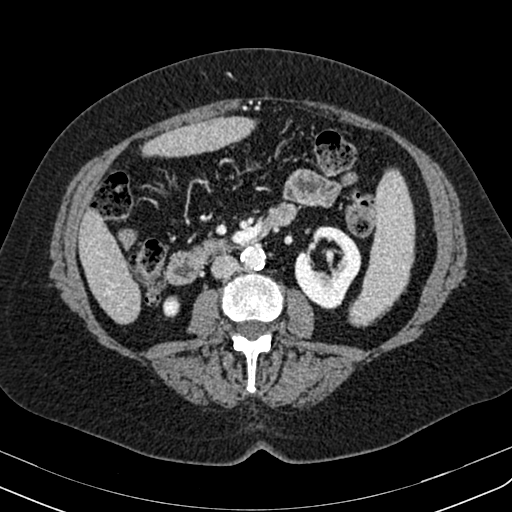
[im 88/241  soft-tissue]
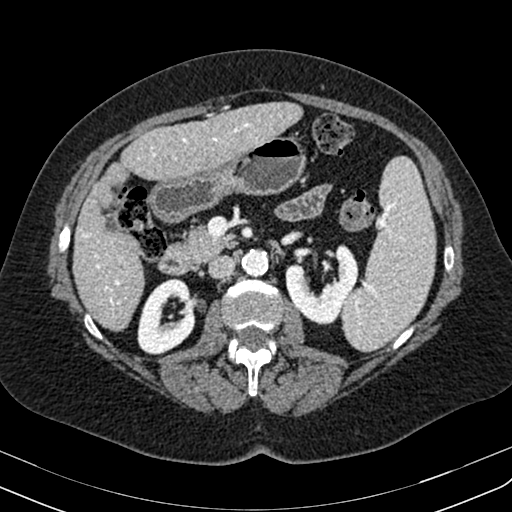
[im 110/241  soft-tissue]
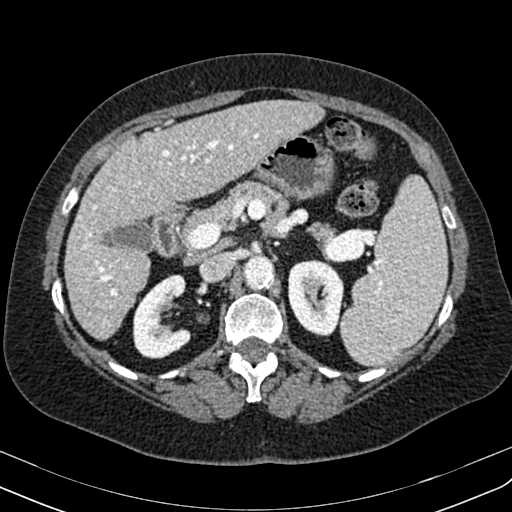
[im 131/241  soft-tissue]
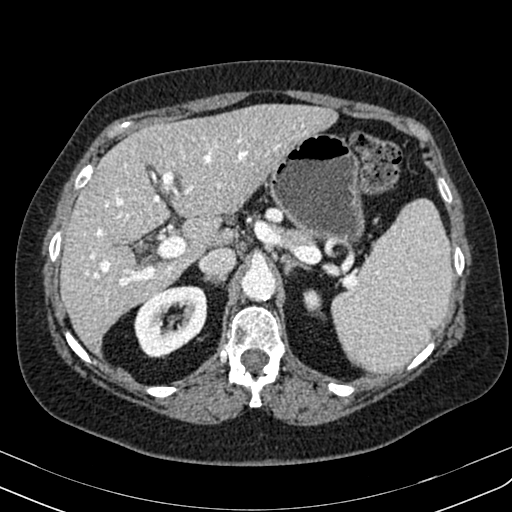
[im 153/241  soft-tissue]
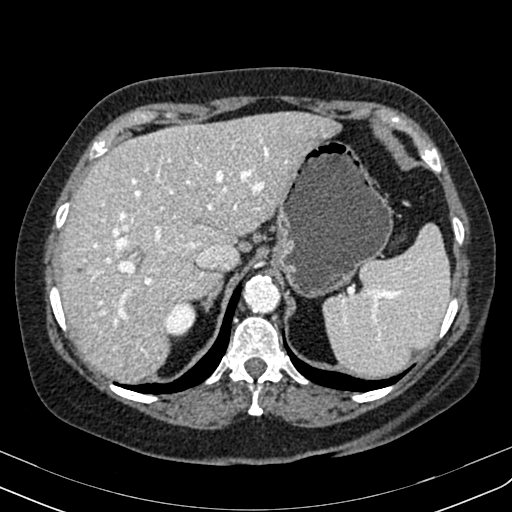
[im 153/241  lung]
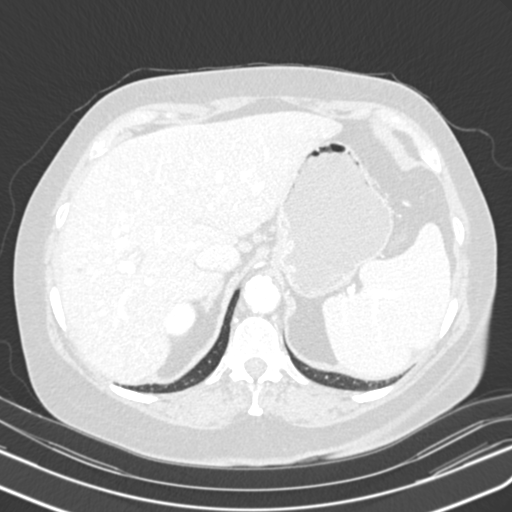
[im 175/241  soft-tissue]
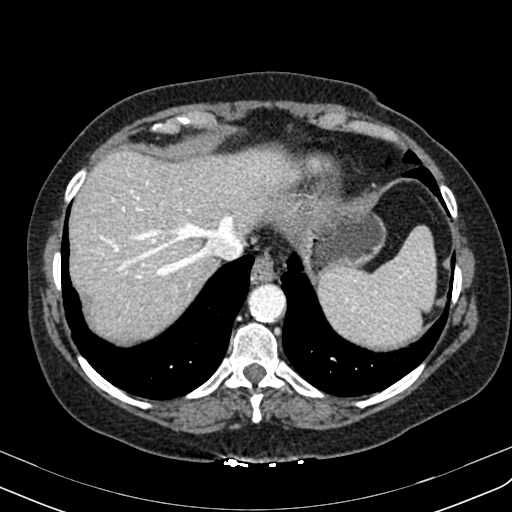
[im 175/241  lung]
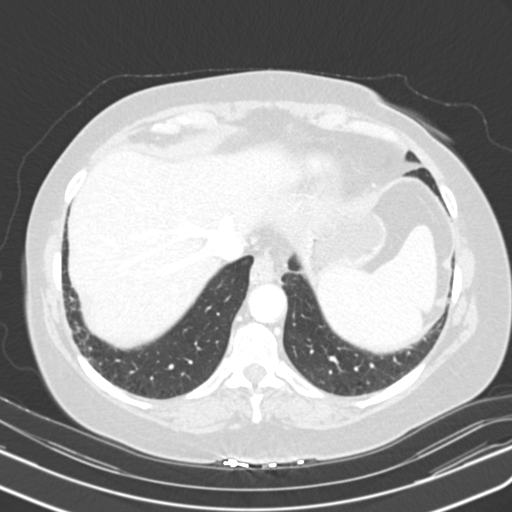
[im 197/241  soft-tissue]
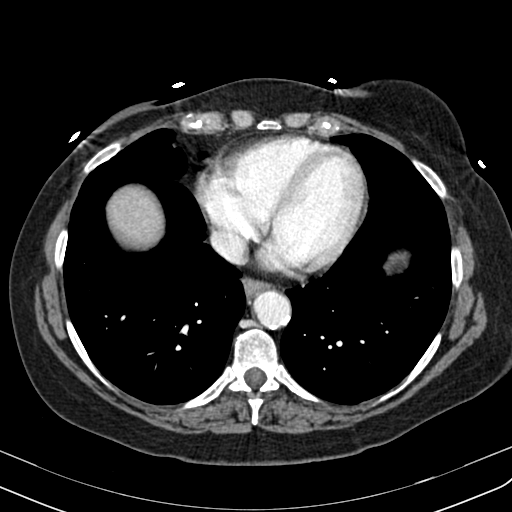
[im 197/241  lung]
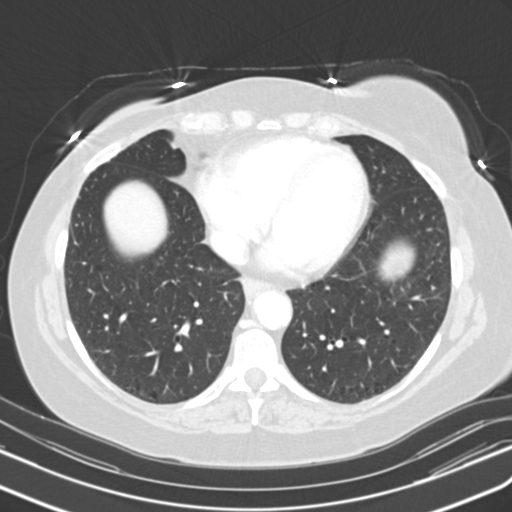
[im 197/241  bone]
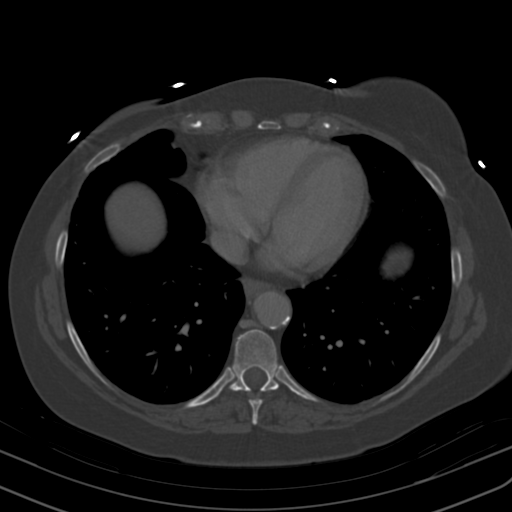
[im 219/241  soft-tissue]
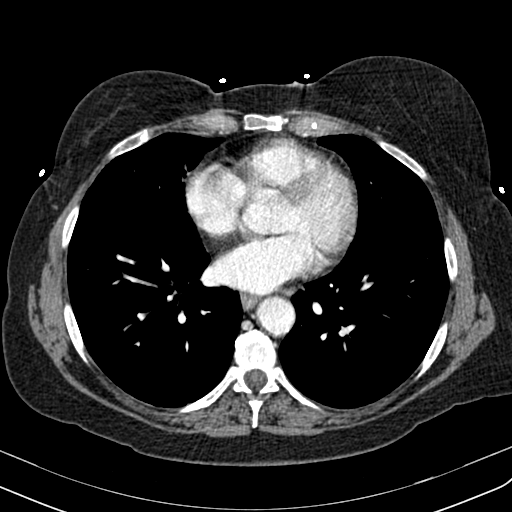
[im 219/241  lung]
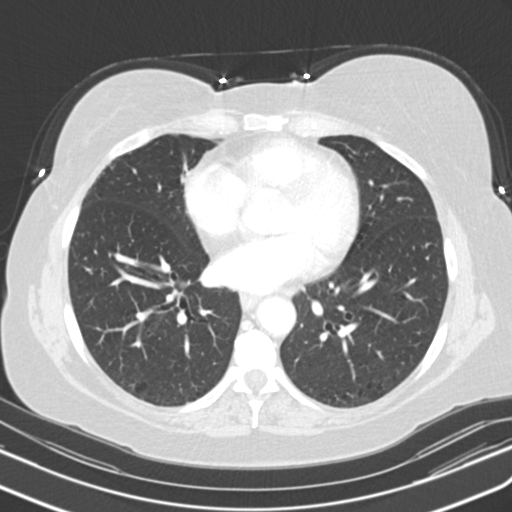

[Series 602: coronal arterial · coronal · arterial · 0.69mm/px · 3 of 101 slices shown]
[im 26/101  soft-tissue]
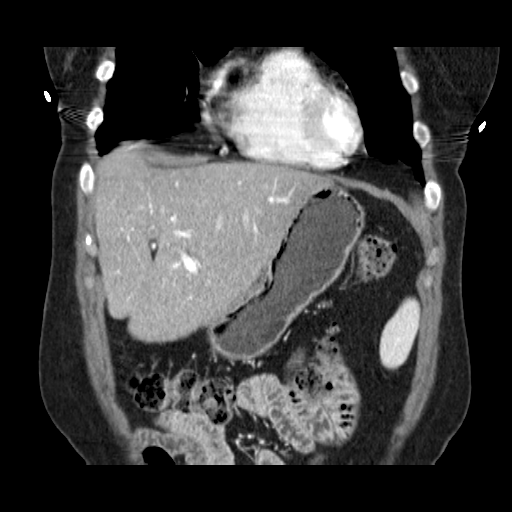
[im 51/101  soft-tissue]
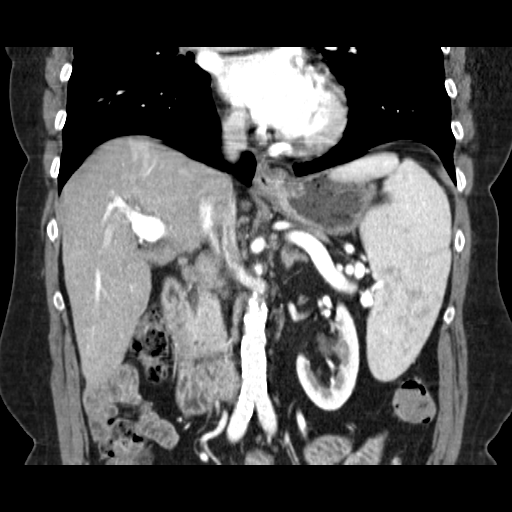
[im 76/101  soft-tissue]
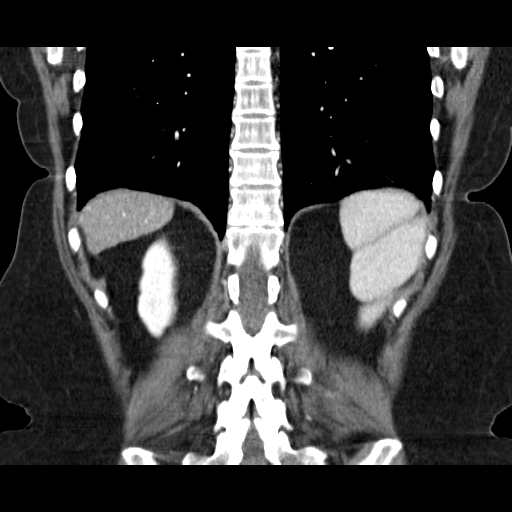

[13 of 46 positions shown; findings below may reference images not displayed]

FINDINGS: Lower chest:  Lung bases are clear.

Hepatobiliary: No focal hepatic lesion. Fine nodular contour of the
liver. No biliary duct dilatation. Gallbladder normal. Common bile
duct normal caliber.

Pancreas: Cystic lesion within the proximal body of the pancreas is
not changed in size in the interval measuring 15 x 10 mm (image 86,
series 3) compared to 15 x 10 mm. There is no clear communication
with the pancreatic duct. No internal enhancement is evident.

Spleen: Normal spleen.

Adrenals/urinary tract: Adrenal glands and kidneys are normal.

Stomach/Bowel: Stomach and limited of the small bowel is
unremarkable

Vascular/Lymphatic: Calcification abdominal aorta. No upper
abdominal lymphadenopathy. There are small lymph nodes in the
gastrohepatic ligament measuring 6 mm. Periportal lymph nodes
measure up to 14 mm. Liver has a fine nodular contour.

Musculoskeletal: No aggressive osseous lesion
IMPRESSION: 1. Stable cysts in the pancreatic body compared to CT of 09/14/2014.
Lesion is essentially unchanged from more remote exam of 05/21/2010.
This is consistent benign etiology.
2. Mildly prominent periportal and gastrohepatic ligament lymph
nodes. Fine nodular contour the liver. Query underlying mild or
early cirrhosis.

## 2018-02-04 ENCOUNTER — Other Ambulatory Visit: Payer: Self-pay | Admitting: Family Medicine

## 2018-02-04 DIAGNOSIS — Z1231 Encounter for screening mammogram for malignant neoplasm of breast: Secondary | ICD-10-CM

## 2018-02-09 ENCOUNTER — Ambulatory Visit
Admission: RE | Admit: 2018-02-09 | Discharge: 2018-02-09 | Disposition: A | Discharge status: Home / Self Care | Payer: Medicare HMO | Source: Ambulatory Visit | Attending: Family Medicine | Admitting: Family Medicine

## 2018-02-09 DIAGNOSIS — Z1231 Encounter for screening mammogram for malignant neoplasm of breast: Secondary | ICD-10-CM | POA: Insufficient documentation

## 2019-03-19 ENCOUNTER — Other Ambulatory Visit: Payer: Self-pay | Admitting: Family Medicine

## 2019-03-19 DIAGNOSIS — Z1231 Encounter for screening mammogram for malignant neoplasm of breast: Secondary | ICD-10-CM

## 2019-04-15 ENCOUNTER — Ambulatory Visit: Payer: Medicare HMO

## 2019-04-20 ENCOUNTER — Encounter (INDEPENDENT_AMBULATORY_CARE_PROVIDER_SITE_OTHER): Payer: Self-pay

## 2019-04-20 ENCOUNTER — Other Ambulatory Visit: Payer: Self-pay

## 2019-04-20 ENCOUNTER — Ambulatory Visit
Admission: RE | Admit: 2019-04-20 | Discharge: 2019-04-20 | Disposition: A | Discharge status: Home / Self Care | Payer: Medicare HMO | Source: Ambulatory Visit | Attending: Family Medicine | Admitting: Family Medicine

## 2019-04-20 DIAGNOSIS — Z1231 Encounter for screening mammogram for malignant neoplasm of breast: Secondary | ICD-10-CM | POA: Diagnosis present

## 2020-04-11 ENCOUNTER — Other Ambulatory Visit: Payer: Self-pay | Admitting: Family Medicine

## 2020-04-11 DIAGNOSIS — Z1231 Encounter for screening mammogram for malignant neoplasm of breast: Secondary | ICD-10-CM

## 2020-04-26 ENCOUNTER — Ambulatory Visit
Admission: RE | Admit: 2020-04-26 | Discharge: 2020-04-26 | Disposition: A | Discharge status: Home / Self Care | Payer: Medicare HMO | Source: Ambulatory Visit | Attending: Family Medicine | Admitting: Family Medicine

## 2020-04-26 ENCOUNTER — Other Ambulatory Visit: Payer: Self-pay

## 2020-04-26 DIAGNOSIS — Z1231 Encounter for screening mammogram for malignant neoplasm of breast: Secondary | ICD-10-CM | POA: Diagnosis not present

## 2021-04-11 ENCOUNTER — Other Ambulatory Visit: Payer: Self-pay | Admitting: Family Medicine

## 2021-04-11 DIAGNOSIS — Z853 Personal history of malignant neoplasm of breast: Secondary | ICD-10-CM

## 2021-04-11 DIAGNOSIS — N644 Mastodynia: Secondary | ICD-10-CM

## 2021-04-27 ENCOUNTER — Ambulatory Visit
Admission: RE | Admit: 2021-04-27 | Discharge: 2021-04-27 | Disposition: A | Discharge status: Home / Self Care | Payer: Medicare HMO | Source: Ambulatory Visit | Attending: Family Medicine | Admitting: Family Medicine

## 2021-04-27 ENCOUNTER — Other Ambulatory Visit: Payer: Self-pay

## 2021-04-27 DIAGNOSIS — Z853 Personal history of malignant neoplasm of breast: Secondary | ICD-10-CM | POA: Insufficient documentation

## 2021-04-27 DIAGNOSIS — N644 Mastodynia: Secondary | ICD-10-CM | POA: Insufficient documentation

## 2022-04-16 ENCOUNTER — Other Ambulatory Visit: Payer: Self-pay | Admitting: Family Medicine

## 2022-04-16 DIAGNOSIS — Z1231 Encounter for screening mammogram for malignant neoplasm of breast: Secondary | ICD-10-CM

## 2022-05-08 ENCOUNTER — Ambulatory Visit
Admission: RE | Admit: 2022-05-08 | Discharge: 2022-05-08 | Disposition: A | Discharge status: Home / Self Care | Payer: Medicare HMO | Source: Ambulatory Visit | Attending: Family Medicine | Admitting: Family Medicine

## 2022-05-08 DIAGNOSIS — Z1231 Encounter for screening mammogram for malignant neoplasm of breast: Secondary | ICD-10-CM | POA: Diagnosis present

## 2023-04-11 ENCOUNTER — Other Ambulatory Visit: Payer: Self-pay | Admitting: Family Medicine

## 2023-04-11 DIAGNOSIS — Z1231 Encounter for screening mammogram for malignant neoplasm of breast: Secondary | ICD-10-CM

## 2023-04-14 ENCOUNTER — Telehealth: Payer: Self-pay

## 2023-04-14 ENCOUNTER — Other Ambulatory Visit: Payer: Self-pay

## 2023-04-14 DIAGNOSIS — Z1211 Encounter for screening for malignant neoplasm of colon: Secondary | ICD-10-CM

## 2023-04-14 MED ORDER — NA SULFATE-K SULFATE-MG SULF 17.5-3.13-1.6 GM/177ML PO SOLN: 1.0000 | Freq: Once | ORAL | 0 refills | Status: AC

## 2023-04-14 NOTE — Telephone Encounter (Signed)
Gastroenterology Pre-Procedure Review  Request Date: 05/30/23 Requesting Physician: Dr. Servando Snare  PATIENT REVIEW QUESTIONS: The patient responded to the following health history questions as indicated:    1. Are you having any GI issues? no 2. Do you have a personal history of Polyps? yes (last colonoscopy was with Dr. Mechele Collin 01/2017) 3. Do you have a family history of Colon Cancer or Polyps? Sister had colon polyps 4. Diabetes Mellitus? no 5. Joint replacements in the past 12 months?no 6. Major health problems in the past 3 months?no 7. Any artificial heart valves, MVP, or defibrillator?no    MEDICATIONS & ALLERGIES:    Patient reports the following regarding taking any anticoagulation/antiplatelet therapy:   Plavix, Coumadin, Eliquis, Xarelto, Lovenox, Pradaxa, Brilinta, or Effient? no Aspirin? no  Patient confirms/reports the following medications:  Current Outpatient Medications  Medication Sig Dispense Refill   Cholecalciferol (VITAMIN D PO) Take by mouth daily.     Multiple Vitamin (MULTIVITAMIN) capsule Take 1 capsule by mouth daily.     Nutritional Supplements (NUTRITIONAL SUPPLEMENT PO) Take by mouth daily. RELEV-IT     Omega-3 Fatty Acids (FISH OIL PO) Take by mouth daily.     No current facility-administered medications for this visit.    Patient confirms/reports the following allergies:  Allergies  Allergen Reactions   Tape Other (See Comments)    Some tapes cause redness (only happened on chest).   Penicillins Rash    No orders of the defined types were placed in this encounter.   AUTHORIZATION INFORMATION Primary Insurance: 1D#: Group #:  Secondary Insurance: 1D#: Group #:  SCHEDULE INFORMATION: Date: 05/30/23 Time: Location: [facility]

## 2023-04-15 MED ORDER — NA SULFATE-K SULFATE-MG SULF 17.5-3.13-1.6 GM/177ML PO SOLN: 1.0000 | Freq: Once | ORAL | 0 refills | Status: AC

## 2023-04-15 NOTE — Addendum Note (Signed)
Addended by: Roena Malady on: 04/15/2023 08:33 AM   Modules accepted: Orders

## 2023-05-12 ENCOUNTER — Ambulatory Visit: Payer: Medicare HMO

## 2023-05-19 ENCOUNTER — Encounter: Payer: Self-pay | Admitting: Gastroenterology

## 2023-05-19 NOTE — Anesthesia Preprocedure Evaluation (Addendum)
Anesthesia Evaluation  Patient identified by MRN, date of birth, ID band Patient awake    Reviewed: Allergy & Precautions, NPO status , Patient's Chart, lab work & pertinent test results  History of Anesthesia Complications Negative for: history of anesthetic complications  Airway Mallampati: III  TM Distance: <3 FB Neck ROM: full    Dental  (+) Upper Dentures, Lower Dentures   Pulmonary neg shortness of breath, former smoker   Pulmonary exam normal        Cardiovascular Exercise Tolerance: Good (-) angina negative cardio ROS Normal cardiovascular exam     Neuro/Psych negative neurological ROS  negative psych ROS   GI/Hepatic negative GI ROS, Neg liver ROS,,,  Endo/Other  negative endocrine ROS    Renal/GU negative Renal ROS  negative genitourinary   Musculoskeletal   Abdominal   Peds  Hematology negative hematology ROS (+)   Anesthesia Other Findings Thalassemia minor  Seasonal allergies Wears dentures  Arthritis Personal history of radiation therapy  Breast cancer     Reproductive/Obstetrics negative OB ROS                             Anesthesia Physical Anesthesia Plan  ASA: 2  Anesthesia Plan: General   Post-op Pain Management:    Induction: Intravenous  PONV Risk Score and Plan: Propofol infusion and TIVA  Airway Management Planned: Natural Airway and Nasal Cannula  Additional Equipment:   Intra-op Plan:   Post-operative Plan:   Informed Consent: I have reviewed the patients History and Physical, chart, labs and discussed the procedure including the risks, benefits and alternatives for the proposed anesthesia with the patient or authorized representative who has indicated his/her understanding and acceptance.     Dental Advisory Given  Plan Discussed with: Anesthesiologist, CRNA and Surgeon  Anesthesia Plan Comments: (Patient consented for risks of  anesthesia including but not limited to:  - adverse reactions to medications - risk of airway placement if required - damage to eyes, teeth, lips or other oral mucosa - nerve damage due to positioning  - sore throat or hoarseness - Damage to heart, brain, nerves, lungs, other parts of body or loss of life  Patient voiced understanding and assent.)       Anesthesia Quick Evaluation

## 2023-06-05 ENCOUNTER — Ambulatory Visit
Admission: RE | Admit: 2023-06-05 | Discharge: 2023-06-05 | Disposition: A | Discharge status: Home / Self Care | Payer: Medicare HMO | Source: Ambulatory Visit | Attending: Family Medicine | Admitting: Family Medicine

## 2023-06-05 DIAGNOSIS — Z1231 Encounter for screening mammogram for malignant neoplasm of breast: Secondary | ICD-10-CM | POA: Diagnosis present

## 2023-07-25 ENCOUNTER — Other Ambulatory Visit: Payer: Self-pay

## 2023-07-25 ENCOUNTER — Encounter
Admission: RE | Disposition: A | Discharge status: Home / Self Care | Payer: Self-pay | Source: Home / Self Care | Attending: Gastroenterology

## 2023-07-25 ENCOUNTER — Encounter: Payer: Self-pay | Admitting: Gastroenterology

## 2023-07-25 ENCOUNTER — Ambulatory Visit: Payer: Medicare HMO | Admitting: Anesthesiology

## 2023-07-25 ENCOUNTER — Ambulatory Visit
Admission: RE | Admit: 2023-07-25 | Discharge: 2023-07-25 | Disposition: A | Discharge status: Home / Self Care | Payer: Medicare HMO | Attending: Gastroenterology | Admitting: Gastroenterology

## 2023-07-25 DIAGNOSIS — Z9089 Acquired absence of other organs: Secondary | ICD-10-CM | POA: Insufficient documentation

## 2023-07-25 DIAGNOSIS — Z803 Family history of malignant neoplasm of breast: Secondary | ICD-10-CM | POA: Insufficient documentation

## 2023-07-25 DIAGNOSIS — K635 Polyp of colon: Secondary | ICD-10-CM

## 2023-07-25 DIAGNOSIS — D563 Thalassemia minor: Secondary | ICD-10-CM | POA: Diagnosis not present

## 2023-07-25 DIAGNOSIS — Z923 Personal history of irradiation: Secondary | ICD-10-CM | POA: Diagnosis not present

## 2023-07-25 DIAGNOSIS — Z853 Personal history of malignant neoplasm of breast: Secondary | ICD-10-CM | POA: Diagnosis not present

## 2023-07-25 DIAGNOSIS — Z9851 Tubal ligation status: Secondary | ICD-10-CM | POA: Diagnosis not present

## 2023-07-25 DIAGNOSIS — K5730 Diverticulosis of large intestine without perforation or abscess without bleeding: Secondary | ICD-10-CM | POA: Insufficient documentation

## 2023-07-25 DIAGNOSIS — Z860101 Personal history of adenomatous and serrated colon polyps: Secondary | ICD-10-CM | POA: Insufficient documentation

## 2023-07-25 DIAGNOSIS — Z87891 Personal history of nicotine dependence: Secondary | ICD-10-CM | POA: Insufficient documentation

## 2023-07-25 DIAGNOSIS — Z860100 Personal history of colon polyps, unspecified: Secondary | ICD-10-CM

## 2023-07-25 DIAGNOSIS — Z1211 Encounter for screening for malignant neoplasm of colon: Secondary | ICD-10-CM | POA: Diagnosis present

## 2023-07-25 DIAGNOSIS — D124 Benign neoplasm of descending colon: Secondary | ICD-10-CM | POA: Diagnosis not present

## 2023-07-25 HISTORY — PX: COLONOSCOPY WITH PROPOFOL: SHX5780

## 2023-07-25 HISTORY — PX: POLYPECTOMY: SHX5525

## 2023-07-25 SURGERY — COLONOSCOPY WITH PROPOFOL
Anesthesia: General | Site: Rectum

## 2023-07-25 MED ORDER — LACTATED RINGERS IV SOLN: INTRAVENOUS | Status: DC

## 2023-07-25 MED ORDER — PROPOFOL 10 MG/ML IV BOLUS
INTRAVENOUS | Status: AC
  Filled 2023-07-25: qty 20

## 2023-07-25 MED ORDER — PROPOFOL 10 MG/ML IV BOLUS
INTRAVENOUS | Status: DC | PRN
  Administered 2023-07-25: 40 mg via INTRAVENOUS
  Administered 2023-07-25: 30 mg via INTRAVENOUS
  Administered 2023-07-25: 40 mg via INTRAVENOUS
  Administered 2023-07-25: 10 mg via INTRAVENOUS
  Administered 2023-07-25: 80 mg via INTRAVENOUS

## 2023-07-25 MED ORDER — LIDOCAINE HCL (CARDIAC) PF 100 MG/5ML IV SOSY
PREFILLED_SYRINGE | INTRAVENOUS | Status: DC | PRN
  Administered 2023-07-25: 60 mg via INTRAVENOUS

## 2023-07-25 MED ORDER — SODIUM CHLORIDE 0.9 % IV SOLN: INTRAVENOUS | Status: DC

## 2023-07-25 MED ORDER — STERILE WATER FOR IRRIGATION IR SOLN
Status: DC | PRN
  Administered 2023-07-25: 1

## 2023-07-25 SURGICAL SUPPLY — 21 items

## 2023-07-25 NOTE — H&P (Signed)
Carolyn Minium, MD [facility] [address], [address] [place], [place] [address] Phone:[phone] Fax : [phone]  Primary Care Physician:  Dortha Kern, MD Primary Gastroenterologist:  Dr. Servando Snare  Pre-Procedure History & Physical: HPI:  Carolyn Coleman is a 77 y.o. female is here for an colonoscopy.   Past Medical History:  Diagnosis Date   Arthritis    fingers   Breast cancer (HCC) 1995   right Patient had radiation   Personal history of radiation therapy    Seasonal allergies    Thalassemia minor    Wears dentures    full upper and lower    Past Surgical History:  Procedure Laterality Date   BREAST BIOPSY Right 1995   positive   BREAST LUMPECTOMY Right    BUNIONECTOMY Right 04/08/2007   Randa Ngo, [facility]   CATARACT EXTRACTION W/PHACO Left 11/20/2015   Procedure: CATARACT EXTRACTION PHACO AND INTRAOCULAR LENS PLACEMENT (IOC);  Surgeon: Sherald Hess, MD;  Location: [facility];  Service: Ophthalmology;  Laterality: Left;   CATARACT EXTRACTION W/PHACO Right 12/11/2015   Procedure: CATARACT EXTRACTION PHACO AND INTRAOCULAR LENS PLACEMENT (IOC);  Surgeon: Sherald Hess, MD;  Location: [facility];  Service: Ophthalmology;  Laterality: Right;   COLONOSCOPY WITH PROPOFOL N/A 02/10/2017   Procedure: COLONOSCOPY WITH PROPOFOL;  Surgeon: Scot Jun, MD;  Location: [facility] END[facility];  Service: Endoscopy;  Laterality: N/A;   ESOPHAGOGASTRODUODENOSCOPY (EGD) WITH PROPOFOL N/A 02/10/2017   Procedure: ESOPHAGOGASTRODUODENOSCOPY (EGD) WITH PROPOFOL;  Surgeon: Scot Jun, MD;  Location: [facility] ENDOSCOPY;  Service: Endoscopy;  Laterality: N/A;   TONSILLECTOMY     TUBAL LIGATION      Prior to Admission medications   Medication Sig Start Date End Date Taking? Authorizing Provider  Cholecalciferol (VITAMIN D PO) Take by mouth daily.   Yes [provider]  diphenhydrAMINE (BENADRYL) 25 mg capsule Take by mouth.   Yes [provider]  ferrous sulfate 325 (65 FE) MG EC tablet Take by mouth.   Yes [provider]  gabapentin (NEURONTIN) 400 MG capsule Take 400 mg by mouth at bedtime.   Yes [provider]  Multiple Vitamin (MULTIVITAMIN) capsule Take 1 capsule by mouth daily.   Yes [provider]  Cholecalciferol (D 1000) 25 MCG (1000 UT) capsule Take by mouth. Patient not taking: Reported on 05/19/2023    [provider]  Nutritional Supplements (NUTRITIONAL SUPPLEMENT PO) Take by mouth daily. RELEV-IT Patient not taking: Reported on 05/19/2023    [provider]  Omega-3 Fatty Acids (FISH OIL PO) Take by mouth daily. Patient not taking: Reported on 05/19/2023    [provider]    Allergies as of 04/14/2023 - Review Complete 04/14/2023  Allergen Reaction Noted   Tape Other (See Comments) 11/14/2015   Penicillins Rash 11/14/2015    Family History  Problem Relation Age of Onset   Breast cancer Maternal Aunt 15    Social History   Socioeconomic History   Marital status: Widowed    Spouse name: Not on file   Number of children: Not on file   Years of education: Not on file   Highest education level: Not on file  Occupational History   Not on file  Tobacco Use   Smoking status: Former    Current packs/day: 0.00    Types: Cigarettes    Quit date: 10/01/1995    Years since quitting: 27.8   Smokeless tobacco: Never  Substance and Sexual Activity   Alcohol  use: No   Drug use: No   Sexual activity: Not on file  Other Topics Concern   Not on file  Social History Narrative   Not on file   Social Determinants of Health   Financial Resource Strain: Not on file  Food Insecurity: Not on file  Transportation Needs: Not on file  Physical Activity: Not on file  Stress: Not on file  Social Connections: Not on file  Intimate Partner Violence: Not on file    Review of Systems: See HPI, otherwise negative ROS  Physical Exam: BP (!) 155/77    Temp 97.9 F (36.6 C) (Temporal)   Resp 17   Ht 5\' 3"  (1.6 m)   Wt 68.8 kg   SpO2 99%   BMI 26.87 kg/m  General:   Alert,  pleasant and cooperative in NAD Head:  Normocephalic and atraumatic. Neck:  Supple; no masses or thyromegaly. Lungs:  Clear throughout to auscultation.    Heart:  Regular rate and rhythm. Abdomen:  Soft, nontender and nondistended. Normal bowel sounds, without guarding, and without rebound.   Neurologic:  Alert and  oriented x4;  grossly normal neurologically.  Impression/Plan: Carolyn Coleman is here for an colonoscopy to be performed for a history of adenomatous polyps on 2018   Risks, benefits, limitations, and alternatives regarding  colonoscopy have been reviewed with the patient.  Questions have been answered.  All parties agreeable.   Carolyn Minium, MD  07/25/2023, 9:31 AM

## 2023-07-25 NOTE — Anesthesia Postprocedure Evaluation (Signed)
Anesthesia Post Note  Patient: Carolyn Coleman  Procedure(s) Performed: COLONOSCOPY WITH PROPOFOL (Rectum) POLYPECTOMY (Rectum)  Patient location during evaluation: Endoscopy Anesthesia Type: General Level of consciousness: awake and alert Pain management: pain level controlled Vital Signs Assessment: post-procedure vital signs reviewed and stable Respiratory status: spontaneous breathing, nonlabored ventilation, respiratory function stable and patient connected to nasal cannula oxygen Cardiovascular status: blood pressure returned to baseline and stable Postop Assessment: no apparent nausea or vomiting Anesthetic complications: no   No notable events documented.   Last Vitals:  Vitals:   07/25/23 1005 07/25/23 1010  BP:  (!) 119/49  Pulse: 97 81  Resp: 13 16  Temp:  36.6 C  SpO2: 99% 99%    Last Pain:  Vitals:   07/25/23 1010  TempSrc:   PainSc: 0-No pain                 Cleda Mccreedy Erico Stan

## 2023-07-25 NOTE — Op Note (Signed)
[facility] Patient Name: Carolyn Coleman Procedure Date: 07/25/2023 9:32 AM MRN: [id number] Account #: [id number] Date of Birth: [birth year] Admit Type: Outpatient Age: 77 Room: [facility] OR ROOM 01 Gender: Female Note Status: Finalized Instrument Name: [id number] Procedure:             Colonoscopy Indications:           High risk colon cancer surveillance: Personal history                         of colonic polyps Providers:             Midge Minium MD, MD Referring MD:          Dortha Kern (Referring MD) Medicines:             Propofol per Anesthesia Complications:         No immediate complications. Procedure:             Pre-Anesthesia Assessment:                        - Prior to the procedure, a History and Physical was                         performed, and patient medications and allergies were                         reviewed. The patient's tolerance of previous                         anesthesia was also reviewed. The risks and benefits                         of the procedure and the sedation options and risks                         were discussed with the patient. All questions were                         answered, and informed consent was obtained. Prior                         Anticoagulants: The patient has taken no anticoagulant                         or antiplatelet agents. ASA Grade Assessment: II - A                         patient with mild systemic disease. After reviewing                         the risks and benefits, the patient was deemed in                         satisfactory condition to undergo the procedure.                        After obtaining informed consent, the colonoscope was  passed under direct vision. Throughout the procedure,                         the patient's blood pressure, pulse, and oxygen                         saturations were monitored continuously. The                          Colonoscope was introduced through the anus and                         advanced to the the cecum, identified by appendiceal                         orifice and ileocecal valve. The colonoscopy was                         performed without difficulty. The patient tolerated                         the procedure well. The quality of the bowel                         preparation was excellent. Findings:      The perianal and digital rectal examinations were normal.      Two sessile polyps were found in the descending colon. The polyps were 3       to 4 mm in size. These polyps were removed with a cold snare. Resection       and retrieval were complete.      Many small-mouthed diverticula were found in the entire colon. Impression:            - Two 3 to 4 mm polyps in the descending colon,                         removed with a cold snare. Resected and retrieved.                        - Diverticulosis in the entire examined colon. Recommendation:        - Discharge patient to home.                        - Resume previous diet.                        - Continue present medications.                        - Await pathology results.                        - Repeat colonoscopy is not recommended for                         surveillance. Procedure Code(s):     --- Professional ---                        [phone], Colonoscopy, flexible; with removal of  tumor(s), polyp(s), or other lesion(s) by snare                         technique Diagnosis Code(s):     --- Professional ---                        Z86.010, Personal history of colonic polyps                        D12.4, Benign neoplasm of descending colon CPT copyright 2022 [employer]. All rights reserved. The codes documented in this report are preliminary and upon coder review may  be revised to meet current compliance requirements. Midge Minium MD, MD 07/25/2023 9:57:45 AM This report has been  signed electronically. Number of Addenda: 0 Note Initiated On: 07/25/2023 9:32 AM Scope Withdrawal Time: 0 hours 7 minutes 23 seconds  Total Procedure Duration: 0 hours 13 minutes 38 seconds  Estimated Blood Loss:  Estimated blood loss: none.      [facility]

## 2023-07-25 NOTE — Transfer of Care (Signed)
Immediate Anesthesia Transfer of Care Note  Patient: Carolyn Coleman  Procedure(s) Performed: COLONOSCOPY WITH PROPOFOL (Rectum) POLYPECTOMY (Rectum)  Patient Location: PACU  Anesthesia Type: General  Level of Consciousness: awake, alert  and patient cooperative  Airway and Oxygen Therapy: Patient Spontanous Breathing and Patient connected to supplemental oxygen  Post-op Assessment: Post-op Vital signs reviewed, Patient's Cardiovascular Status Stable, Respiratory Function Stable, Patent Airway and No signs of Nausea or vomiting  Post-op Vital Signs: Reviewed and stable  Complications: No notable events documented.

## 2023-07-26 ENCOUNTER — Encounter: Payer: Self-pay | Admitting: Gastroenterology

## 2023-07-28 LAB — SURGICAL PATHOLOGY

## 2023-07-29 ENCOUNTER — Encounter: Payer: Self-pay | Admitting: Gastroenterology

## 2023-08-14 ENCOUNTER — Ambulatory Visit
Admission: EM | Admit: 2023-08-14 | Discharge: 2023-08-14 | Disposition: A | Discharge status: Home / Self Care | Payer: Medicare HMO

## 2023-08-14 DIAGNOSIS — J069 Acute upper respiratory infection, unspecified: Secondary | ICD-10-CM | POA: Diagnosis not present

## 2023-08-14 DIAGNOSIS — G479 Sleep disorder, unspecified: Secondary | ICD-10-CM | POA: Diagnosis not present

## 2023-08-14 MED ORDER — GUAIFENESIN-CODEINE 100-10 MG/5ML PO SOLN
10.0000 mL | Freq: Three times a day (TID) | ORAL | 0 refills | Status: AC | PRN
Start: 2023-08-14 — End: ?

## 2023-08-14 NOTE — ED Provider Notes (Signed)
[facility]    CSN: [id number] Arrival date & time: 08/14/23  1139      History   Chief Complaint Chief Complaint  Patient presents with   Cough    HPI VAIBHAVI CZAPLEWSKI is a 77 y.o. female presenting for 5-day a history of somewhat productive cough.  Also reports fatigue and difficulty sleeping at night due to cough over the past 3 days.  Denies fever, sore throat, sinus pain, chest pain, wheezing or shortness of breath.  Denies any sick contacts.  Has been taking over-the-counter cough medicine but says it has not helped.  She request something to help her sleep at night.  Says that she has had codeine cough medicine before and it has been helpful.  She does report taking COVID test at home and states it was negative.  HPI  Past Medical History:  Diagnosis Date   Arthritis    fingers   Breast cancer (HCC) 1995   right Patient had radiation   Personal history of radiation therapy    Seasonal allergies    Thalassemia minor    Wears dentures    full upper and lower    Patient Active Problem List   Diagnosis Date Noted   Hx of colonic polyps 07/25/2023   Polyp of descending colon 07/25/2023    Past Surgical History:  Procedure Laterality Date   BREAST BIOPSY Right 1995   positive   BREAST LUMPECTOMY Right    BUNIONECTOMY Right 04/08/2007   Randa Ngo, [facility]   CATARACT EXTRACTION W/PHACO Left 11/20/2015   Procedure: CATARACT EXTRACTION PHACO AND INTRAOCULAR LENS PLACEMENT (IOC);  Surgeon: Sherald Hess, MD;  Location: [facility];  Service: Ophthalmology;  Laterality: Left;   CATARACT EXTRACTION W/PHACO Right 12/11/2015   Procedure: CATARACT EXTRACTION PHACO AND INTRAOCULAR LENS PLACEMENT (IOC);  Surgeon: Sherald Hess, MD;  Location: [facility];  Service: Ophthalmology;  Laterality: Right;   COLONOSCOPY WITH PROPOFOL N/A 02/10/2017   Procedure: COLONOSCOPY WITH PROPOFOL;  Surgeon: Scot Jun, MD;  Location: [facility]  ENDOSCOPY;  Service: Endoscopy;  Laterality: N/A;   COLONOSCOPY WITH PROPOFOL N/A 07/25/2023   Procedure: COLONOSCOPY WITH PROPOFOL;  Surgeon: Midge Minium, MD;  Location: [facility];  Service: Endoscopy;  Laterality: N/A;   ESOPHAGOGASTRODUODENOSCOPY (EGD) WITH PROPOFOL N/A 02/10/2017   Procedure: ESOPHAGOGASTRODUODENOSCOPY (EGD) WITH PROPOFOL;  Surgeon: Scot Jun, MD;  Location: [facility] ENDOSCOPY;  Service: Endoscopy;  Laterality: N/A;   POLYPECTOMY  07/25/2023   Procedure: POLYPECTOMY;  Surgeon: Midge Minium, MD;  Location: [facility];  Service: Endoscopy;;   TONSILLECTOMY     TUBAL LIGATION      OB History   No obstetric history on file.      Home Medications    Prior to Admission medications   Medication Sig Start Date End Date Taking? Authorizing Provider  Cholecalciferol (VITAMIN D PO) Take by mouth daily.   Yes [provider]  diphenhydrAMINE (BENADRYL) 25 mg capsule Take by mouth.   Yes [provider]  ferrous sulfate 325 (65 FE) MG EC tablet Take by mouth.   Yes [provider]  gabapentin (NEURONTIN) 400 MG capsule Take 400 mg by mouth at bedtime.   Yes [provider]  guaiFENesin-codeine 100-10 MG/5ML syrup Take 10 mLs by mouth 3 (three) times daily as needed for cough. 08/14/23  Yes Shirlee Latch, PA-C  Multiple Vitamin (MULTIVITAMIN) capsule Take 1 capsule by mouth daily.   Yes [provider]  Nutritional Supplements (NUTRITIONAL SUPPLEMENT PO) Take by mouth daily. RELEV-IT   Yes [provider]  triamterene-hydrochlorothiazide (MAXZIDE-25) 37.5-25 MG tablet  06/18/23  Yes [provider]  Cholecalciferol (D 1000) 25 MCG (1000 UT) capsule Take by mouth. Patient not taking: Reported on 05/19/2023    [provider]  Omega-3 Fatty Acids (FISH OIL PO) Take by mouth daily. Patient not taking: Reported on 05/19/2023    [provider]    Family History Family History   Problem Relation Age of Onset   Breast cancer Maternal Aunt 51    Social History Social History   Tobacco Use   Smoking status: Former    Current packs/day: 0.00    Types: Cigarettes    Quit date: 10/01/1995    Years since quitting: 27.8   Smokeless tobacco: Never  Vaping Use   Vaping status: Never Used  Substance Use Topics   Alcohol use: No   Drug use: No     Allergies   Tape and Penicillins   Review of Systems Review of Systems  Constitutional:  Positive for fatigue. Negative for chills, diaphoresis and fever.  HENT:  Positive for congestion. Negative for ear pain, rhinorrhea, sinus pressure, sinus pain and sore throat.   Respiratory:  Positive for cough. Negative for shortness of breath.   Gastrointestinal:  Negative for abdominal pain, nausea and vomiting.  Musculoskeletal:  Negative for arthralgias and myalgias.  Skin:  Negative for rash.  Neurological:  Negative for weakness and headaches.  Hematological:  Negative for adenopathy.  Psychiatric/Behavioral:  Positive for sleep disturbance.      Physical Exam Triage Vital Signs ED Triage Vitals  Encounter Vitals Group     BP 08/14/23 1153 (!) 130/48     Systolic BP Percentile --      Diastolic BP Percentile --      Pulse Rate 08/14/23 1153 (!) 114     Resp 08/14/23 1153 16     Temp 08/14/23 1153 98.6 F (37 C)     Temp Source 08/14/23 1153 Oral     SpO2 08/14/23 1153 98 %     Weight 08/14/23 1151 154 lb (69.9 kg)     Height 08/14/23 1151 5\' 3"  (1.6 m)     Head Circumference --      Peak Flow --      Pain Score 08/14/23 1151 0     Pain Loc --      Pain Education --      Exclude from Growth Chart --    No data found.  Updated Vital Signs BP (!) 130/48 (BP Location: Left Arm)   Pulse (!) 114   Temp 98.6 F (37 C) (Oral)   Resp 16   Ht 5\' 3"  (1.6 m)   Wt 154 lb (69.9 kg)   SpO2 98%   BMI 27.28 kg/m     Physical Exam Vitals and nursing note reviewed.  Constitutional:      General: She is  not in acute distress.    Appearance: Normal appearance. She is not ill-appearing or toxic-appearing.  HENT:     Head: Normocephalic and atraumatic.     Nose: Congestion present.     Mouth/Throat:     Mouth: Mucous membranes are moist.     Pharynx: Oropharynx is clear.  Eyes:     General: No scleral icterus.       Right eye: No discharge.        Left eye: No discharge.  Conjunctiva/sclera: Conjunctivae normal.  Cardiovascular:     Rate and Rhythm: Regular rhythm. Tachycardia present.     Heart sounds: Normal heart sounds.  Pulmonary:     Effort: Pulmonary effort is normal. No respiratory distress.     Breath sounds: Normal breath sounds.  Musculoskeletal:     Cervical back: Neck supple.  Skin:    General: Skin is dry.  Neurological:     General: No focal deficit present.     Mental Status: She is alert. Mental status is at baseline.     Motor: No weakness.     Gait: Gait normal.  Psychiatric:        Mood and Affect: Mood normal.        Behavior: Behavior normal.        Thought Content: Thought content normal.      UC Treatments / Results  Labs (all labs ordered are listed, but only abnormal results are displayed) Labs Reviewed - No data to display  EKG   Radiology No results found.  Procedures Procedures (including critical care time)  Medications Ordered in UC Medications - No data to display  Initial Impression / Assessment and Plan / UC Course  I have reviewed the triage vital signs and the nursing notes.  Pertinent labs & imaging results that were available during my care of the patient were reviewed by me and considered in my medical decision making (see chart for details).   77 year old female presents for cough, congestion, fatigue and difficulty sleeping x 5 days.  No associated fever, sore throat, sinus pain, chest pain, wheezing or shortness of breath.  Negative COVID test.  She is afebrile and overall well-appearing.  On exam she has slight  nasal congestion.  Chest is clear.  Advised patient symptoms are consistent with viral illness.  Supportive care encouraged with increasing rest and fluids.  I did prescribe patient Cheratussin since she tried multiple over-the-counter cough medicines without relief.  Reviewed that she should return for reevaluation if fever, worsening cough, chest pain or shortness of breath.   Final Clinical Impressions(s) / UC Diagnoses   Final diagnoses:  Viral URI with cough  Difficulty sleeping     Discharge Instructions      URI/COLD SYMPTOMS: Your exam today is consistent with a viral illness. Antibiotics are not indicated at this time. Use medications as directed, including cough syrup, nasal saline, and decongestants. Your symptoms should improve over the next few days and resolve within 7-10 days. Increase rest and fluids. F/u if symptoms worsen or predominate such as sore throat, ear pain, productive cough, shortness of breath, or if you develop high fevers or worsening fatigue over the next several days.    Return if fever, worsening cough, chest pain or shortness of breath.   ED Prescriptions     Medication Sig Dispense Auth. Provider   guaiFENesin-codeine 100-10 MG/5ML syrup Take 10 mLs by mouth 3 (three) times daily as needed for cough. 120 mL Shirlee Latch, PA-C      I have reviewed the PDMP during this encounter.   Shirlee Latch, PA-C 08/14/23 1237

## 2023-08-14 NOTE — Discharge Instructions (Signed)
URI/COLD SYMPTOMS: Your exam today is consistent with a viral illness. Antibiotics are not indicated at this time. Use medications as directed, including cough syrup, nasal saline, and decongestants. Your symptoms should improve over the next few days and resolve within 7-10 days. Increase rest and fluids. F/u if symptoms worsen or predominate such as sore throat, ear pain, productive cough, shortness of breath, or if you develop high fevers or worsening fatigue over the next several days.    Return if fever, worsening cough, chest pain or shortness of breath.

## 2023-08-14 NOTE — ED Triage Notes (Signed)
Pt c/o cough x5days  Pt states that she just started coughing and did a home covid test and it was negative.  Pt asks for medicine to help her sleep at night.

## 2024-05-14 ENCOUNTER — Other Ambulatory Visit: Payer: Self-pay | Admitting: Family Medicine

## 2024-05-14 DIAGNOSIS — Z1231 Encounter for screening mammogram for malignant neoplasm of breast: Secondary | ICD-10-CM

## 2024-06-17 ENCOUNTER — Ambulatory Visit

## 2024-07-08 ENCOUNTER — Ambulatory Visit
Admission: RE | Admit: 2024-07-08 | Discharge: 2024-07-08 | Disposition: A | Discharge status: Home / Self Care | Source: Ambulatory Visit | Attending: Family Medicine | Admitting: Family Medicine

## 2024-07-08 DIAGNOSIS — Z1231 Encounter for screening mammogram for malignant neoplasm of breast: Secondary | ICD-10-CM | POA: Diagnosis present
